# Patient Record
Sex: Male | Born: 1960 | Race: Black or African American | Hispanic: No | Smoking: Never smoker
Health system: Southern US, Community
[De-identification: ages and names within clinical notes are randomized; demographics above are authoritative.]

## PROBLEM LIST (undated history)

## (undated) DIAGNOSIS — E291 Testicular hypofunction: Secondary | ICD-10-CM

## (undated) DIAGNOSIS — R569 Unspecified convulsions: Secondary | ICD-10-CM

## (undated) DIAGNOSIS — E559 Vitamin D deficiency, unspecified: Secondary | ICD-10-CM

## (undated) DIAGNOSIS — F32A Depression, unspecified: Secondary | ICD-10-CM

## (undated) DIAGNOSIS — F329 Major depressive disorder, single episode, unspecified: Secondary | ICD-10-CM

## (undated) DIAGNOSIS — G8929 Other chronic pain: Secondary | ICD-10-CM

## (undated) DIAGNOSIS — E785 Hyperlipidemia, unspecified: Secondary | ICD-10-CM

## (undated) DIAGNOSIS — S069X9A Unspecified intracranial injury with loss of consciousness of unspecified duration, initial encounter: Secondary | ICD-10-CM

## (undated) DIAGNOSIS — S069XAA Unspecified intracranial injury with loss of consciousness status unknown, initial encounter: Secondary | ICD-10-CM

## (undated) DIAGNOSIS — G44309 Post-traumatic headache, unspecified, not intractable: Secondary | ICD-10-CM

## (undated) DIAGNOSIS — I10 Essential (primary) hypertension: Secondary | ICD-10-CM

## (undated) HISTORY — DX: Other chronic pain: G89.29

## (undated) HISTORY — DX: Unspecified convulsions: R56.9

## (undated) HISTORY — DX: Major depressive disorder, single episode, unspecified: F32.9

## (undated) HISTORY — DX: Post-traumatic headache, unspecified, not intractable: G44.309

## (undated) HISTORY — DX: Hyperlipidemia, unspecified: E78.5

## (undated) HISTORY — PX: COLONOSCOPY: SHX174

## (undated) HISTORY — DX: Unspecified intracranial injury with loss of consciousness status unknown, initial encounter: S06.9XAA

## (undated) HISTORY — DX: Depression, unspecified: F32.A

## (undated) HISTORY — DX: Unspecified intracranial injury with loss of consciousness of unspecified duration, initial encounter: S06.9X9A

## (undated) HISTORY — DX: Essential (primary) hypertension: I10

---
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
## 2007-07-30 ENCOUNTER — Emergency Department (HOSPITAL_COMMUNITY): Admission: EM | Admit: 2007-07-30 | Discharge: 2007-07-30 | Payer: Self-pay | Admitting: Emergency Medicine

## 2009-05-15 IMAGING — CR DG CHEST 2V
2 series · 2 of 2 positions shown · non-contrast
Comparison: None.

CLINICAL DATA: Shortness of breath.  Fever.  
 CHEST ? 2 VIEW:

[view not recorded (1 of 2)]
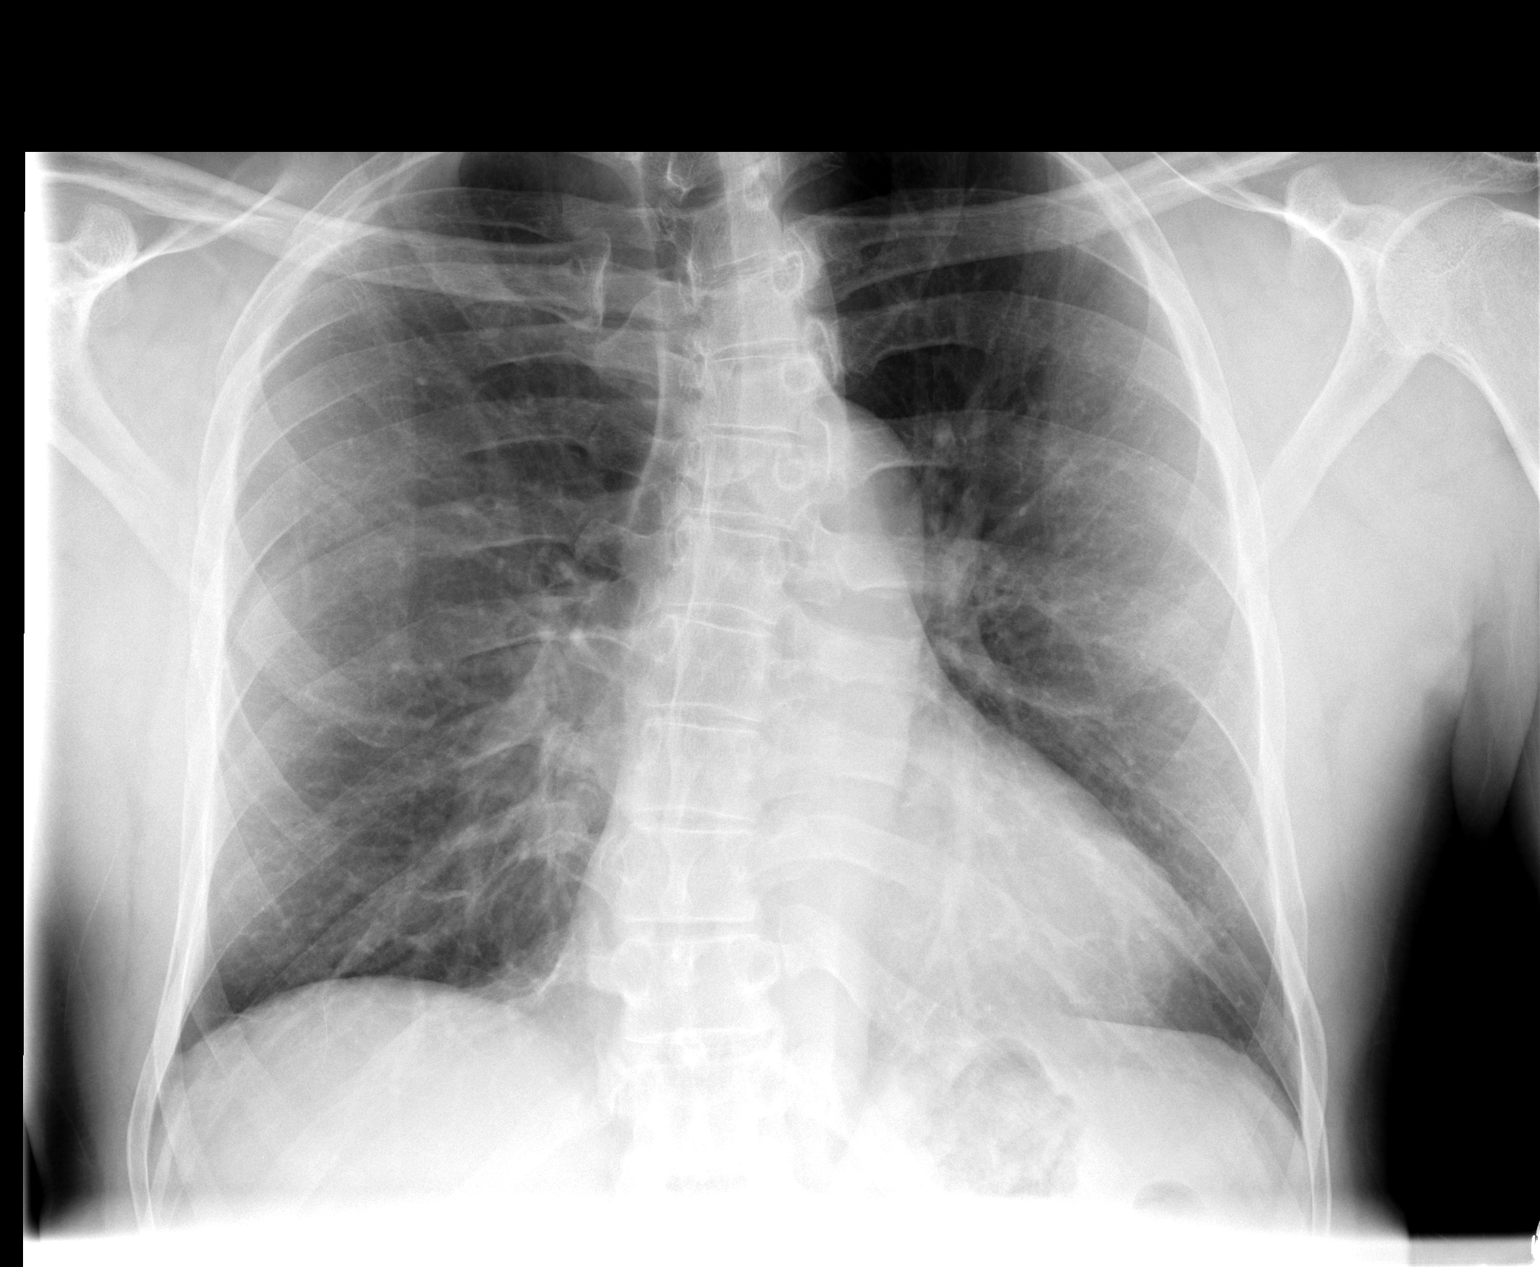

[view not recorded (2 of 2)]
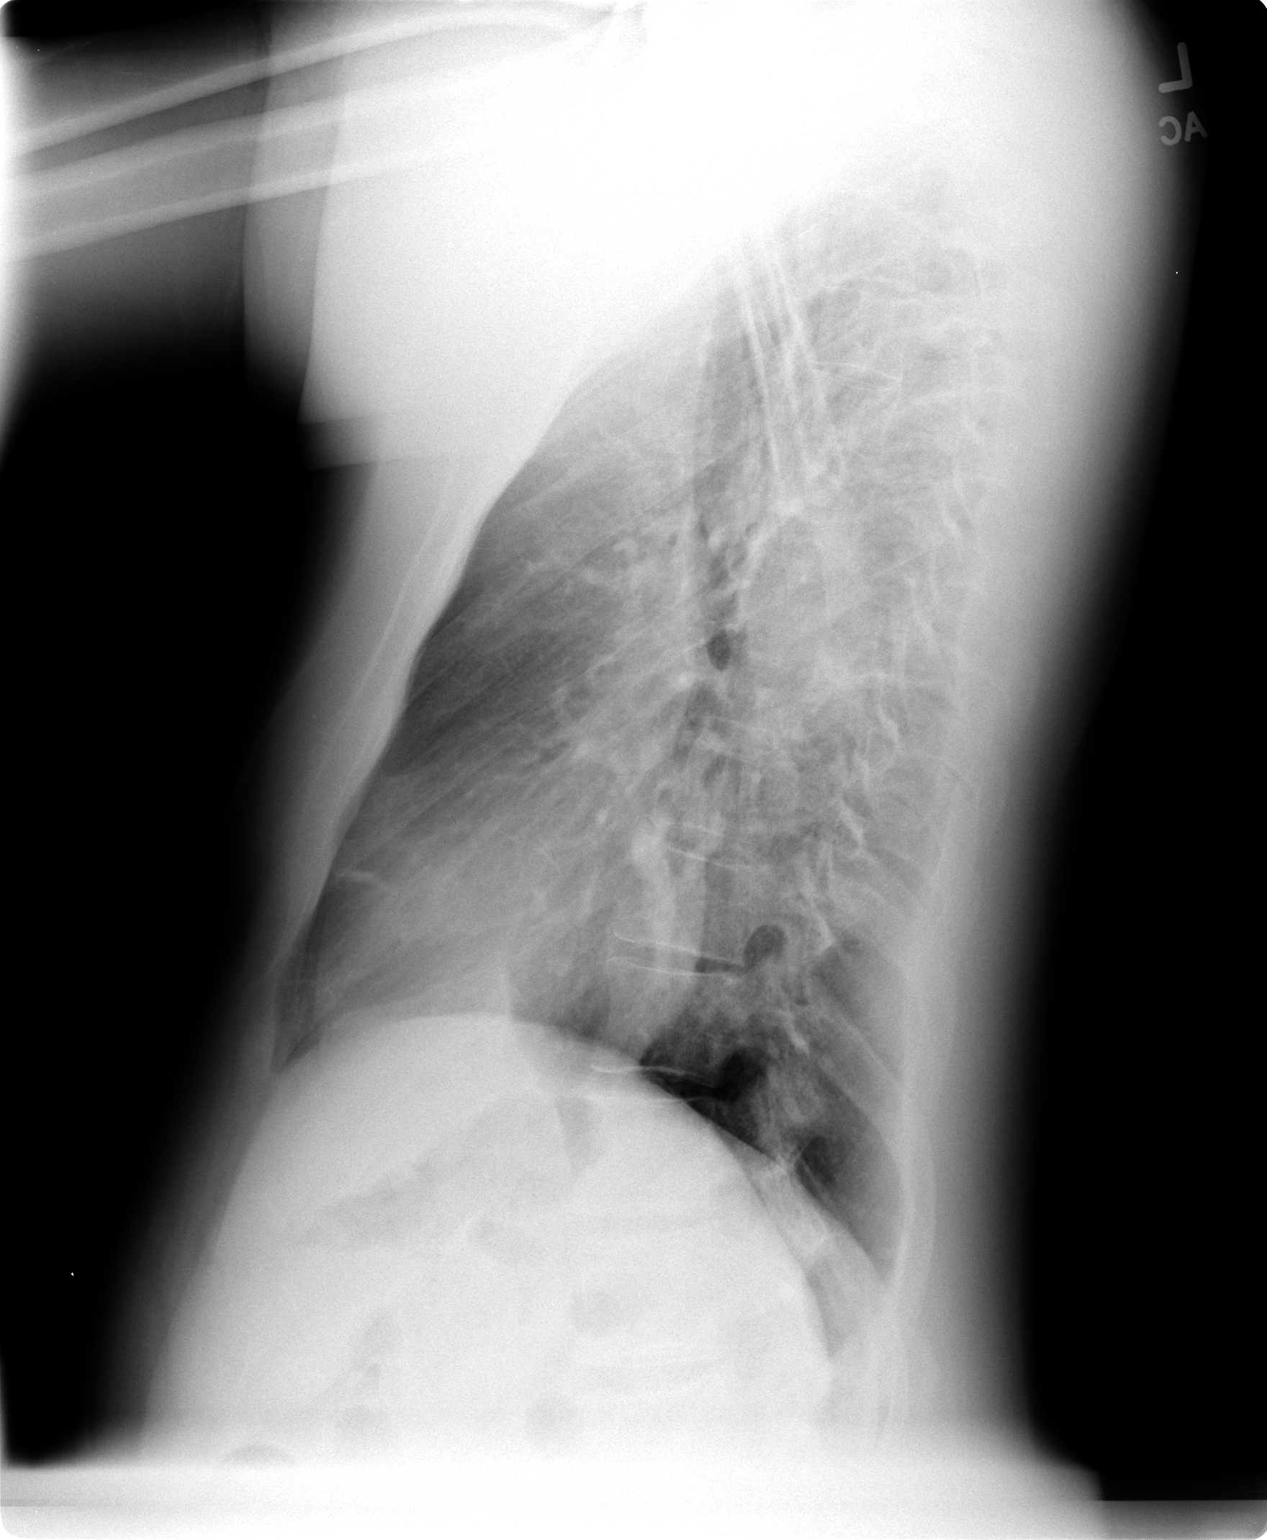

[2 of 2 positions shown; findings below may reference images not displayed]

FINDINGS: There is scoliosis and thoracic lordosis.  There is no infiltrate or effusion.  The lungs are clear.
IMPRESSION: No active cardiopulmonary disease.

## 2011-04-09 LAB — CBC
HCT: 41.2
Hemoglobin: 14.1
Platelets: 469 — ABNORMAL HIGH
WBC: 6.7

## 2011-04-09 LAB — BASIC METABOLIC PANEL
Chloride: 102
Creatinine, Ser: 1.11
Potassium: 3.9

## 2011-04-09 LAB — INFLUENZA A+B VIRUS AG-DIRECT(RAPID): Influenza B Ag: NEGATIVE

## 2011-04-09 LAB — URINALYSIS, ROUTINE W REFLEX MICROSCOPIC
Nitrite: NEGATIVE
Specific Gravity, Urine: 1.02
pH: 7

## 2011-04-09 LAB — DIFFERENTIAL
Eosinophils Relative: 3
Lymphocytes Relative: 34
Lymphs Abs: 2.3

## 2011-09-12 ENCOUNTER — Ambulatory Visit (INDEPENDENT_AMBULATORY_CARE_PROVIDER_SITE_OTHER): Payer: BC Managed Care – PPO | Admitting: Family Medicine

## 2011-09-12 DIAGNOSIS — E782 Mixed hyperlipidemia: Secondary | ICD-10-CM

## 2011-09-12 DIAGNOSIS — E785 Hyperlipidemia, unspecified: Secondary | ICD-10-CM

## 2011-09-12 DIAGNOSIS — I10 Essential (primary) hypertension: Secondary | ICD-10-CM

## 2011-09-12 LAB — COMPREHENSIVE METABOLIC PANEL
ALT: 19 U/L (ref 0–53)
AST: 19 U/L (ref 0–37)
Albumin: 4.6 g/dL (ref 3.5–5.2)
Alkaline Phosphatase: 87 U/L (ref 39–117)
Glucose, Bld: 81 mg/dL (ref 70–99)
Potassium: 5.1 mEq/L (ref 3.5–5.3)
Sodium: 139 mEq/L (ref 135–145)
Total Protein: 7.9 g/dL (ref 6.0–8.3)

## 2011-09-12 LAB — LIPID PANEL
LDL Cholesterol: 83 mg/dL (ref 0–99)
Triglycerides: 109 mg/dL (ref <150)

## 2011-09-12 MED ORDER — LISINOPRIL 10 MG PO TABS: 10.0000 mg | ORAL_TABLET | Freq: Every day | ORAL | Status: DC

## 2011-09-12 MED ORDER — SIMVASTATIN 20 MG PO TABS: 20.0000 mg | ORAL_TABLET | Freq: Every evening | ORAL | Status: DC

## 2011-09-12 NOTE — Patient Instructions (Signed)
Continue to get regular exercise and keep your weight good. If you have problems arise come in for a recheck.

## 2011-09-12 NOTE — Progress Notes (Signed)
Subjective: Patient is here for a routine visit with regard to his blood pressure and cholesterol he needs a refill of his prescriptions. He has no other major acute complaints. His review of systems is essentially unremarkable. No breathing or chest symptoms. 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  Objective: Healthy-appearing Afro-American male in no acute distress. TMs are normal. Throat clear. Eyes PERRLA. Neck supple without significant nodes. Chest is clear to auscultation. Heart regular without murmurs gallops or arrhythmias.  Assessment  hypertension, good control Hyperlipidemia  Plan: We'll check his labs. Will continue him on the same medications. He is asked to come back in about 6 months   When labs are reviewed please note that the patient did eat some cereal for breakfast today

## 2011-09-15 ENCOUNTER — Encounter: Payer: Self-pay | Admitting: *Deleted

## 2012-05-19 ENCOUNTER — Other Ambulatory Visit: Payer: Self-pay | Admitting: Family Medicine

## 2012-06-23 ENCOUNTER — Other Ambulatory Visit: Payer: Self-pay | Admitting: Physician Assistant

## 2012-06-23 NOTE — Telephone Encounter (Signed)
Needs OV /labs

## 2012-07-24 ENCOUNTER — Other Ambulatory Visit: Payer: Self-pay | Admitting: Physician Assistant

## 2012-07-24 ENCOUNTER — Ambulatory Visit (INDEPENDENT_AMBULATORY_CARE_PROVIDER_SITE_OTHER): Payer: BC Managed Care – PPO | Admitting: Family Medicine

## 2012-07-24 VITALS — BP 123/81 | HR 76 | Temp 98.1°F | Resp 16 | Ht 72.0 in | Wt 180.6 lb

## 2012-07-24 DIAGNOSIS — E785 Hyperlipidemia, unspecified: Secondary | ICD-10-CM

## 2012-07-24 DIAGNOSIS — I10 Essential (primary) hypertension: Secondary | ICD-10-CM

## 2012-07-24 LAB — COMPREHENSIVE METABOLIC PANEL
ALT: 20 U/L (ref 0–53)
AST: 17 U/L (ref 0–37)
Albumin: 4.2 g/dL (ref 3.5–5.2)
CO2: 28 mEq/L (ref 19–32)
Calcium: 9.8 mg/dL (ref 8.4–10.5)
Chloride: 105 mEq/L (ref 96–112)
Potassium: 4.8 mEq/L (ref 3.5–5.3)
Sodium: 140 mEq/L (ref 135–145)
Total Protein: 7.5 g/dL (ref 6.0–8.3)

## 2012-07-24 LAB — LIPID PANEL: Cholesterol: 173 mg/dL (ref 0–200)

## 2012-07-24 MED ORDER — LISINOPRIL 10 MG PO TABS: 10.0000 mg | ORAL_TABLET | Freq: Every day | ORAL | Status: DC

## 2012-07-24 MED ORDER — SIMVASTATIN 20 MG PO TABS: 20.0000 mg | ORAL_TABLET | Freq: Every day | ORAL | Status: DC

## 2012-07-24 NOTE — Progress Notes (Signed)
Subjective: Travis Henry is here for a followup visit and refill on his medications. He runs out of them today. He's been doing well. He denies any new problems. No headaches or dizziness. No chest pain or shortness of breath. No GI or other symptoms. He had a colonoscopy 1 year ago. He's been getting 30 day prescriptions but we are going to give them and ninety-day intervals now. No other questions or complaints.  Objective: Pleasant gentleman no acute distress. Weight is the same as last year. His TMs are normal. Throat clear. Neck supple without nodes or thyromegaly. Chest is clear to auscultation. Heart regular without murmurs gallops or arrhythmias. Abdomen soft without organomegaly mass or tenderness.  Assessment: Hypertension, good control Hyperlipidemia, good control on the last set of labs  Plan: Check lipids and comprehensive metabolic panel. We'll let her know the results of his labs. Sent his medicines to the pharmacy, leaving him on the same meds. Return one year.

## 2012-07-24 NOTE — Telephone Encounter (Signed)
°  WAS HERE 20 MIN AGO; GOT PRESCRIPTION FOR BP MEDICATION AND IT HASN'T GOT TO PHARMACY YET....   [phone]

## 2012-07-24 NOTE — Patient Instructions (Addendum)
Continue same medicines.  Return if problems arise.  Return in one year

## 2012-07-25 ENCOUNTER — Encounter: Payer: Self-pay | Admitting: *Deleted

## 2013-04-20 ENCOUNTER — Other Ambulatory Visit: Payer: Self-pay | Admitting: Family Medicine

## 2013-07-18 ENCOUNTER — Other Ambulatory Visit: Payer: Self-pay | Admitting: Family Medicine

## 2013-08-05 ENCOUNTER — Ambulatory Visit (INDEPENDENT_AMBULATORY_CARE_PROVIDER_SITE_OTHER): Payer: BC Managed Care – PPO | Admitting: Emergency Medicine

## 2013-08-05 VITALS — BP 124/84 | HR 74 | Temp 98.4°F | Resp 18 | Wt 187.0 lb

## 2013-08-05 DIAGNOSIS — Z0000 Encounter for general adult medical examination without abnormal findings: Secondary | ICD-10-CM

## 2013-08-05 DIAGNOSIS — I10 Essential (primary) hypertension: Secondary | ICD-10-CM

## 2013-08-05 DIAGNOSIS — E785 Hyperlipidemia, unspecified: Secondary | ICD-10-CM

## 2013-08-05 LAB — COMPREHENSIVE METABOLIC PANEL
ALBUMIN: 4.3 g/dL (ref 3.5–5.2)
ALK PHOS: 87 U/L (ref 39–117)
ALT: 16 U/L (ref 0–53)
AST: 16 U/L (ref 0–37)
BUN: 15 mg/dL (ref 6–23)
CO2: 27 mEq/L (ref 19–32)
Calcium: 9.7 mg/dL (ref 8.4–10.5)
Chloride: 105 mEq/L (ref 96–112)
Creat: 1.1 mg/dL (ref 0.50–1.35)
GLUCOSE: 82 mg/dL (ref 70–99)
POTASSIUM: 4.8 meq/L (ref 3.5–5.3)
SODIUM: 140 meq/L (ref 135–145)
TOTAL PROTEIN: 7.7 g/dL (ref 6.0–8.3)
Total Bilirubin: 0.8 mg/dL (ref 0.3–1.2)

## 2013-08-05 LAB — POCT URINALYSIS DIPSTICK
Bilirubin, UA: NEGATIVE
Blood, UA: NEGATIVE
GLUCOSE UA: NEGATIVE
Ketones, UA: NEGATIVE
Leukocytes, UA: NEGATIVE
NITRITE UA: NEGATIVE
UROBILINOGEN UA: 0.2
pH, UA: 5.5

## 2013-08-05 LAB — POCT CBC
GRANULOCYTE PERCENT: 45.8 % (ref 37–80)
HEMATOCRIT: 46.3 % (ref 43.5–53.7)
HEMOGLOBIN: 15 g/dL (ref 14.1–18.1)
Lymph, poc: 2.5 (ref 0.6–3.4)
MCH, POC: 29.1 pg (ref 27–31.2)
MCHC: 32.4 g/dL (ref 31.8–35.4)
MCV: 89.7 fL (ref 80–97)
MID (CBC): 0.5 (ref 0–0.9)
MPV: 8.2 fL (ref 0–99.8)
PLATELET COUNT, POC: 392 10*3/uL (ref 142–424)
POC Granulocyte: 2.5 (ref 2–6.9)
POC LYMPH PERCENT: 45.4 %L (ref 10–50)
POC MID %: 8.8 %M (ref 0–12)
RBC: 5.16 M/uL (ref 4.69–6.13)
RDW, POC: 13.6 %
WBC: 5.4 10*3/uL (ref 4.6–10.2)

## 2013-08-05 LAB — LIPID PANEL
Cholesterol: 157 mg/dL (ref 0–200)
HDL: 51 mg/dL (ref >39)
LDL CALC: 80 mg/dL (ref 0–99)
Total CHOL/HDL Ratio: 3.1 Ratio
Triglycerides: 130 mg/dL (ref <150)
VLDL: 26 mg/dL (ref 0–40)

## 2013-08-05 MED ORDER — LISINOPRIL 10 MG PO TABS: 10.0000 mg | ORAL_TABLET | Freq: Every day | ORAL | Status: DC

## 2013-08-05 NOTE — Progress Notes (Signed)
[facility] [address], [place] [id number] [phone]  Date:  08/05/2013   Name:  Travis Henry   DOB:  June 01, 1961   MRN:  [id number]  PCP:  Nilda SimmerSMITH,KRISTI, MD    Chief Complaint: Hypertension   History of Present Illness:  Travis Henry is a 53 y.o. very pleasant male patient who presents with the following:  History of HBP and elevated cholesterol.  Due to run out of meds and needs refills.  Tolerating medication with no adverse effects.  No evidence of end organ disease.  Had colonoscopy last year.  Nonsmoker.  Had a flu shot.  No improvement with over the counter medications or other home remedies. Denies other complaint or health concern today.   Patient Active Problem List   Diagnosis Date Noted  . HTN (hypertension) 07/24/2012  . Hyperlipidemia 07/24/2012    No past medical history on file.  No past surgical history on file.  History  Substance Use Topics  . Smoking status: Never Smoker   . Smokeless tobacco: Not on file  . Alcohol Use: No    No family history on file.  No Known Allergies  Medication list has been reviewed and updated.  Current Outpatient Prescriptions on File Prior to Visit  Medication Sig Dispense Refill  . lisinopril (PRINIVIL,ZESTRIL) 10 MG tablet TAKE 1 TABLET EVERY DAY  30 tablet  0  . simvastatin (ZOCOR) 20 MG tablet TAKE 1 TABLET (20 MG TOTAL) BY MOUTH EVERY EVENING.  30 tablet  0   No current facility-administered medications on file prior to visit.    Review of Systems:  As per HPI, otherwise negative.    Physical Examination: Filed Vitals:   08/05/13 1122  BP: 124/84  Pulse: 74  Temp: 98.4 F (36.9 C)  Resp: 18   Filed Vitals:   08/05/13 1122  Weight: 187 lb (84.823 kg)   Body mass index is 25.36 kg/(m^2). Ideal Body Weight:    GEN: WDWN, NAD, Non-toxic, A & O x 3 HEENT: Atraumatic, Normocephalic. Neck supple. No masses, No LAD. Ears and Nose: No external deformity. CV: RRR,  No M/G/R. No JVD. No thrill. No extra heart sounds. PULM: CTA B, no wheezes, crackles, rhonchi. No retractions. No resp. distress. No accessory muscle use. ABD: S, NT, ND, +BS. No rebound. No HSM. EXTR: No c/c/e NEURO Normal gait.  PSYCH: Normally interactive. Conversant. Not depressed or anxious appearing.  Calm demeanor.    Assessment and Plan: Wellness examination Labs pending Hypertension Hyperlipidemia  Signed,  Phillips OdorJeffery Anderson, MD

## 2013-08-05 NOTE — Patient Instructions (Signed)

## 2013-08-06 LAB — PSA: PSA: 1.03 ng/mL (ref <=4.00)

## 2013-09-23 ENCOUNTER — Other Ambulatory Visit: Payer: Self-pay | Admitting: Physician Assistant

## 2013-09-24 NOTE — Telephone Encounter (Signed)
Patient called stated he is out of his meds for BP & cholesterol. [facility] until today he had no refills left. Patient is going out of town and needs this asap. [facility] [place]. Please let pt know when done. Patient stated normally his med refills are for a year and this time only for 3 months. Best # for patient [phone]

## 2013-12-21 ENCOUNTER — Other Ambulatory Visit: Payer: Self-pay | Admitting: Emergency Medicine

## 2014-02-16 ENCOUNTER — Other Ambulatory Visit: Payer: Self-pay | Admitting: Emergency Medicine

## 2014-02-17 ENCOUNTER — Other Ambulatory Visit: Payer: Self-pay | Admitting: Emergency Medicine

## 2014-03-16 ENCOUNTER — Other Ambulatory Visit: Payer: Self-pay | Admitting: Physician Assistant

## 2014-03-17 ENCOUNTER — Ambulatory Visit (INDEPENDENT_AMBULATORY_CARE_PROVIDER_SITE_OTHER): Payer: BC Managed Care – PPO | Admitting: Family Medicine

## 2014-03-17 VITALS — BP 134/90 | HR 70 | Temp 98.0°F | Resp 16 | Ht 72.0 in | Wt 189.4 lb

## 2014-03-17 DIAGNOSIS — I10 Essential (primary) hypertension: Secondary | ICD-10-CM

## 2014-03-17 DIAGNOSIS — E785 Hyperlipidemia, unspecified: Secondary | ICD-10-CM

## 2014-03-17 MED ORDER — LISINOPRIL 10 MG PO TABS: 10.0000 mg | ORAL_TABLET | Freq: Every day | ORAL | Status: DC

## 2014-03-17 MED ORDER — SIMVASTATIN 20 MG PO TABS: 20.0000 mg | ORAL_TABLET | Freq: Every day | ORAL | Status: DC

## 2014-03-17 NOTE — Progress Notes (Signed)
° °  Subjective:    Patient ID: Travis Henry, male    DOB: June 29, 1961, 53 y.o.   MRN: [id number]  HPI Chief Complaint  Patient presents with   Medication Refill    Lisinopril & Simvastatin-Request 90 day supply on both medication   This chart was scribed for Elvina Sidle , MD by Andrew Au, ED Scribe. This patient was seen in room 1 and the patient's care was started at 1:32 PM.  HPI Comments: Travis Henry is a 53 y.o. male who [facility]ill. Pt is requesting medication refill of lisinopril and simvastatin. Pt state he is tolerating medication well. Pt reports last blood work was 7 months ago. Pt is a non smoker. Pt has family h/o MI. 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  Past Medical History  Diagnosis Date   Hyperlipidemia    Hypertension    History reviewed. No pertinent past surgical history. Prior to Admission medications   Medication Sig Start Date End Date Taking? Authorizing Provider  ibuprofen (ADVIL,MOTRIN) 200 MG tablet Take 200 mg by mouth every 6 (six) hours as needed.   Yes Historical Provider, MD  lisinopril (PRINIVIL,ZESTRIL) 10 MG tablet Take 1 tablet (10 mg total) by mouth daily. PATIENT NEEDS OFFICE VISIT FOR ADDITIONAL REFILLS 02/16/14  Yes Heather M Marte, PA-C  simvastatin (ZOCOR) 20 MG tablet TAKE 1 TABLET (20 MG TOTAL) BY MOUTH EVERY EVENING. 07/24/12  Yes Anders Simmonds, PA-C   Review of Systems     Objective:   Physical Exam  Nursing note and vitals reviewed. Constitutional: He is oriented to person, place, and time. He appears well-developed and well-nourished. No distress.  HENT:  Head: Normocephalic and atraumatic.  Eyes: Conjunctivae and EOM are normal.  Neck: Neck supple.  Cardiovascular: Normal rate, regular rhythm and normal heart sounds.  Exam reveals no gallop and no friction rub.   No murmur heard. Pulmonary/Chest: Effort normal and  breath sounds normal. No respiratory distress. He has no wheezes. He has no rales. He exhibits no tenderness.  Musculoskeletal: Normal range of motion.  Neurological: He is alert and oriented to person, place, and time.  Skin: Skin is warm and dry.  Psychiatric: He has a normal mood and affect. His behavior is normal.   Filed Vitals:   03/17/14 1257  BP: 134/90  Pulse: 70  Temp: 98 F (36.7 C)  TempSrc: Oral  Resp: 16  Height: 6' (1.829 m)  Weight: 189 lb 6 oz (85.9 kg)  SpO2: 99%   Assessment & Plan:   Essential hypertension - Plan: lisinopril (PRINIVIL,ZESTRIL) 10 MG tablet  Hyperlipidemia - Plan: simvastatin (ZOCOR) 20 MG tablet  Signed, Elvina Sidle, MD

## 2014-03-17 NOTE — Patient Instructions (Signed)

## 2014-12-17 ENCOUNTER — Other Ambulatory Visit: Payer: Self-pay | Admitting: Family Medicine

## 2015-01-19 ENCOUNTER — Other Ambulatory Visit: Payer: Self-pay | Admitting: Family Medicine

## 2015-01-21 ENCOUNTER — Ambulatory Visit (INDEPENDENT_AMBULATORY_CARE_PROVIDER_SITE_OTHER): Payer: BLUE CROSS/BLUE SHIELD | Admitting: Emergency Medicine

## 2015-01-21 VITALS — BP 140/80 | HR 70 | Temp 98.3°F | Resp 16 | Ht 72.0 in | Wt 192.0 lb

## 2015-01-21 DIAGNOSIS — I10 Essential (primary) hypertension: Secondary | ICD-10-CM | POA: Diagnosis not present

## 2015-01-21 DIAGNOSIS — E785 Hyperlipidemia, unspecified: Secondary | ICD-10-CM | POA: Diagnosis not present

## 2015-01-21 DIAGNOSIS — Z125 Encounter for screening for malignant neoplasm of prostate: Secondary | ICD-10-CM

## 2015-01-21 LAB — COMPLETE METABOLIC PANEL WITH GFR
ALK PHOS: 91 U/L (ref 39–117)
ALT: 17 U/L (ref 0–53)
AST: 15 U/L (ref 0–37)
Albumin: 4.1 g/dL (ref 3.5–5.2)
BILIRUBIN TOTAL: 0.8 mg/dL (ref 0.2–1.2)
BUN: 13 mg/dL (ref 6–23)
CALCIUM: 9.3 mg/dL (ref 8.4–10.5)
CO2: 25 mEq/L (ref 19–32)
CREATININE: 1.24 mg/dL (ref 0.50–1.35)
Chloride: 105 mEq/L (ref 96–112)
GFR, EST AFRICAN AMERICAN: 77 mL/min
GFR, Est Non African American: 67 mL/min
GLUCOSE: 97 mg/dL (ref 70–99)
Potassium: 4.6 mEq/L (ref 3.5–5.3)
Sodium: 141 mEq/L (ref 135–145)
TOTAL PROTEIN: 7.4 g/dL (ref 6.0–8.3)

## 2015-01-21 LAB — POCT CBC
Granulocyte percent: 50.7 %G (ref 37–80)
HEMATOCRIT: 42.4 % — AB (ref 43.5–53.7)
Hemoglobin: 14.5 g/dL (ref 14.1–18.1)
Lymph, poc: 1.9 (ref 0.6–3.4)
MCH, POC: 28.4 pg (ref 27–31.2)
MCHC: 34.3 g/dL (ref 31.8–35.4)
MCV: 82.9 fL (ref 80–97)
MID (cbc): 0.3 (ref 0–0.9)
MPV: 6.9 fL (ref 0–99.8)
PLATELET COUNT, POC: 414 10*3/uL (ref 142–424)
POC Granulocyte: 2.2 (ref 2–6.9)
POC LYMPH PERCENT: 42.8 %L (ref 10–50)
POC MID %: 6.5 %M (ref 0–12)
RBC: 5.11 M/uL (ref 4.69–6.13)
RDW, POC: 12.6 %
WBC: 4.4 10*3/uL — AB (ref 4.6–10.2)

## 2015-01-21 LAB — LIPID PANEL
CHOL/HDL RATIO: 2.9 ratio
Cholesterol: 161 mg/dL (ref 0–200)
HDL: 56 mg/dL (ref >=40)
LDL CALC: 82 mg/dL (ref 0–99)
Triglycerides: 113 mg/dL (ref <150)
VLDL: 23 mg/dL (ref 0–40)

## 2015-01-21 LAB — PSA: PSA: 0.96 ng/mL (ref <=4.00)

## 2015-01-21 MED ORDER — LISINOPRIL 10 MG PO TABS: ORAL_TABLET | ORAL | Status: DC

## 2015-01-21 MED ORDER — SIMVASTATIN 20 MG PO TABS: 20.0000 mg | ORAL_TABLET | Freq: Every day | ORAL | Status: DC

## 2015-01-21 NOTE — Progress Notes (Signed)
Subjective:  This chart was scribed for Lesle Chris MD, by Veverly Fells, at [facility].  This patient was seen in room 14 and the patient's care was started at 9:00 AM.    Patient ID: Travis Henry, male    DOB: February 22, 1961, 54 y.o.   MRN: [id number] Chief Complaint  Patient presents with  . Medication Refill    Lisinopril and Zocor    HPI  HPI Comments: Travis Henry is a 54 y.o. male who [facility] medication refill (Lisinopril and Zocor).  Patient denies any chest pain/difficulty breathing and has been compliant with his cholesterol and blood pressure medication.  His father passed from a heart attack at the age of 87 ( was a smoker).  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  Patient does not smoke.  He is up to date with his colonoscopy.  He has no other complaints today.   Patient Active Problem List   Diagnosis Date Noted  . HTN (hypertension) 07/24/2012  . Hyperlipidemia 07/24/2012   Past Medical History  Diagnosis Date  . Hyperlipidemia   . Hypertension    History reviewed. No pertinent past surgical history. No Known Allergies Prior to Admission medications   Medication Sig Start Date End Date Taking? Authorizing Provider  ibuprofen (ADVIL,MOTRIN) 200 MG tablet Take 200 mg by mouth every 6 (six) hours as needed.   Yes Historical Provider, MD  lisinopril (PRINIVIL,ZESTRIL) 10 MG tablet Take 1 tablet (10 mg total) by mouth daily. PATIENT NEEDS OFFICE VISIT FOR ADDITIONAL REFILLS 12/18/14  Yes Ethelda Chick, MD  simvastatin (ZOCOR) 20 MG tablet Take 1 tablet (20 mg total) by mouth daily at 6 PM. 03/17/14  Yes Elvina Sidle, MD   History   Social History  . Marital Status: Married    Spouse Name: N/A  . Number of Children: N/A  . Years of Education: N/A   Occupational History  . Not on file.   Social History Main  Topics  . Smoking status: Never Smoker   . Smokeless tobacco: Never Used  . Alcohol Use: No  . Drug Use: No  . Sexual Activity: Yes   Other Topics Concern  . Not on file   Social History Narrative     Current Outpatient Prescriptions on File Prior to Visit  Medication Sig Dispense Refill  . ibuprofen (ADVIL,MOTRIN) 200 MG tablet Take 200 mg by mouth every 6 (six) hours as needed.    Marland Kitchen lisinopril (PRINIVIL,ZESTRIL) 10 MG tablet Take 1 tablet (10 mg total) by mouth daily. PATIENT NEEDS OFFICE VISIT FOR ADDITIONAL REFILLS 30 tablet 0  . simvastatin (ZOCOR) 20 MG tablet Take 1 tablet (20 mg total) by mouth daily at 6 PM. 90 tablet 2   No current facility-administered medications on file prior to visit.    No Known Allergies     Review of Systems  Constitutional: Negative for fever and chills.  Eyes: Negative for pain, discharge and itching.  Respiratory: Negative for choking, chest tightness and shortness of breath.   Cardiovascular: Negative for chest pain.  Gastrointestinal: Negative for nausea and vomiting.  Musculoskeletal: Positive for back pain. Negative for myalgias and neck stiffness.       Objective:   Physical Exam  CONSTITUTIONAL: Well developed/well nourished HEAD: Normocephalic/atraumatic EYES: EOMI/PERRL ENMT: Mucous membranes moist NECK: supple no meningeal signs SPINE/BACK:entire  spine nontender CV: S1/S2 noted, no murmurs/rubs/gallops noted LUNGS: Lungs are clear to auscultation bilaterally, no apparent distress ABDOMEN: soft, nontender, no rebound or guarding, bowel sounds noted throughout abdomen GU:no cva tenderness NEURO: Pt is awake/alert/appropriate, moves all extremitiesx4.  No facial droop.   EXTREMITIES: pulses normal/equal, full ROM SKIN: warm, color normal PSYCH: no abnormalities of mood noted, alert and oriented to situation Results for orders placed or performed in visit on 01/21/15  POCT CBC  Result Value Ref Range   WBC 4.4 (A) 4.6  - 10.2 K/uL   Lymph, poc 1.9 0.6 - 3.4   POC LYMPH PERCENT 42.8 10 - 50 %L   MID (cbc) 0.3 0 - 0.9   POC MID % 6.5 0 - 12 %M   POC Granulocyte 2.2 2 - 6.9   Granulocyte percent 50.7 37 - 80 %G   RBC 5.11 4.69 - 6.13 M/uL   Hemoglobin 14.5 14.1 - 18.1 g/dL   HCT, POC 16.142.4 (A) 09.643.5 - 53.7 %   MCV 82.9 80 - 97 fL   MCH, POC 28.4 27 - 31.2 pg   MCHC 34.3 31.8 - 35.4 g/dL   RDW, POC 04.512.6 %   Platelet Count, POC 414 142 - 424 K/uL   MPV 6.9 0 - 99.8 fL    Filed Vitals:   01/21/15 0854  BP: 140/80  Pulse: 70  Temp: 98.3 F (36.8 C)  TempSrc: Oral  Resp: 16  Height: 6' (1.829 m)  Weight: 192 lb (87.091 kg)  SpO2: 99%       Assessment & Plan:  1. Essential hypertension I also started a baby aspirin daily because of his history of hypertension and high cholesterol as well as a family history of heart disease - POCT CBC - COMPLETE METABOLIC PANEL WITH GFR - lisinopril (PRINIVIL,ZESTRIL) 10 MG tablet; Take 1 tablet daily  Dispense: 90 tablet; Refill: 3  2. Hyperlipidemia  - Lipid panel - simvastatin (ZOCOR) 20 MG tablet; Take 1 tablet (20 mg total) by mouth daily at 6 PM.  Dispense: 90 tablet; Refill: 3  3. Prostate cancer screening  - PSA   I personally performed the services described in this documentation, which was scribed in my presence. The recorded information has been reviewed and is accurate.  Lesle ChrisSteven Melenie Minniear, MD  [facility]  01/21/2015 9:44 AM

## 2015-01-21 NOTE — Patient Instructions (Addendum)
Health Maintenance   Take a baby aspirin everyday.   A healthy lifestyle and preventative care can promote health and wellness.  Maintain regular health, dental, and eye exams.  Eat a healthy diet. Foods like vegetables, fruits, whole grains, low-fat dairy products, and lean protein foods contain the nutrients you need and are low in calories. Decrease your intake of foods high in solid fats, added sugars, and salt. Get information about a proper diet from your health care provider, if necessary.  Regular physical exercise is one of the most important things you can do for your health. Most adults should get at least 150 minutes of moderate-intensity exercise (any activity that increases your heart rate and causes you to sweat) each week. In addition, most adults need muscle-strengthening exercises on 2 or more days a week.   Maintain a healthy weight. The body mass index (BMI) is a screening tool to identify possible weight problems. It provides an estimate of body fat based on height and weight. Your health care provider can find your BMI and can help you achieve or maintain a healthy weight. For males 20 years and older:  A BMI below 18.5 is considered underweight.  A BMI of 18.5 to 24.9 is normal.  A BMI of 25 to 29.9 is considered overweight.  A BMI of 30 and above is considered obese.  Maintain normal blood lipids and cholesterol by exercising and minimizing your intake of saturated fat. Eat a balanced diet with plenty of fruits and vegetables. Blood tests for lipids and cholesterol should begin at age 52 and be repeated every 5 years. If your lipid or cholesterol levels are high, you are over age 52, or you are at high risk for heart disease, you may need your cholesterol levels checked more frequently.Ongoing high lipid and cholesterol levels should be treated with medicines if diet and exercise are not working.  If you smoke, find out from your health care provider how to quit. If  you do not use tobacco, do not start.  Lung cancer screening is recommended for adults aged 55-80 years who are at high risk for developing lung cancer because of a history of smoking. A yearly low-dose CT scan of the lungs is recommended for people who have at least a 30-pack-year history of smoking and are current smokers or have quit within the past 15 years. A pack year of smoking is smoking an average of 1 pack of cigarettes a day for 1 year (for example, a 30-pack-year history of smoking could mean smoking 1 pack a day for 30 years or 2 packs a day for 15 years). Yearly screening should continue until the smoker has stopped smoking for at least 15 years. Yearly screening should be stopped for people who develop a health problem that would prevent them from having lung cancer treatment.  If you choose to drink alcohol, do not have more than 2 drinks per day. One drink is considered to be 12 oz (360 mL) of beer, 5 oz (150 mL) of wine, or 1.5 oz (45 mL) of liquor.  Avoid the use of street drugs. Do not share needles with anyone. Ask for help if you need support or instructions about stopping the use of drugs.  High blood pressure causes heart disease and increases the risk of stroke. Blood pressure should be checked at least every 1-2 years. Ongoing high blood pressure should be treated with medicines if weight loss and exercise are not effective.  If you are  13-17 years old, ask your health care provider if you should take aspirin to prevent heart disease.  Diabetes screening involves taking a blood sample to check your fasting blood sugar level. This should be done once every 3 years after age 35 if you are at a normal weight and without risk factors for diabetes. Testing should be considered at a younger age or be carried out more frequently if you are overweight and have at least 1 risk factor for diabetes.  Colorectal cancer can be detected and often prevented. Most routine colorectal cancer  screening begins at the age of 66 and continues through age 66. However, your health care provider may recommend screening at an earlier age if you have risk factors for colon cancer. On a yearly basis, your health care provider may provide home test kits to check for hidden blood in the stool. A small camera at the end of a tube may be used to directly examine the colon (sigmoidoscopy or colonoscopy) to detect the earliest forms of colorectal cancer. Talk to your health care provider about this at age 39 when routine screening begins. A direct exam of the colon should be repeated every 5-10 years through age 15, unless early forms of precancerous polyps or small growths are found.  People who are at an increased risk for hepatitis B should be screened for this virus. You are considered at high risk for hepatitis B if:  You were born in a country where hepatitis B occurs often. Talk with your health care provider about which countries are considered high risk.  Your parents were born in a high-risk country and you have not received a shot to protect against hepatitis B (hepatitis B vaccine).  You have HIV or AIDS.  You use needles to inject street drugs.  You live with, or have sex with, someone who has hepatitis B.  You are a man who has sex with other men (MSM).  You get hemodialysis treatment.  You take certain medicines for conditions like cancer, organ transplantation, and autoimmune conditions.  Hepatitis C blood testing is recommended for all people born from [birth year] through 1963 and any individual with known risk factors for hepatitis C.  Healthy men should no longer receive prostate-specific antigen (PSA) blood tests as part of routine cancer screening. Talk to your health care provider about prostate cancer screening.  Testicular cancer screening is not recommended for adolescents or adult males who have no symptoms. Screening includes self-exam, a health care provider exam, and other  screening tests. Consult with your health care provider about any symptoms you have or any concerns you have about testicular cancer.  Practice safe sex. Use condoms and avoid high-risk sexual practices to reduce the spread of sexually transmitted infections (STIs).  You should be screened for STIs, including gonorrhea and chlamydia if:  You are sexually active and are younger than 24 years.  You are older than 24 years, and your health care provider tells you that you are at risk for this type of infection.  Your sexual activity has changed since you were last screened, and you are at an increased risk for chlamydia or gonorrhea. Ask your health care provider if you are at risk.  If you are at risk of being infected with HIV, it is recommended that you take a prescription medicine daily to prevent HIV infection. This is called pre-exposure prophylaxis (PrEP). You are considered at risk if:  You are a man who has sex with other  men (MSM).  You are a heterosexual man who is sexually active with multiple partners.  You take drugs by injection.  You are sexually active with a partner who has HIV.  Talk with your health care provider about whether you are at high risk of being infected with HIV. If you choose to begin PrEP, you should first be tested for HIV. You should then be tested every 3 months for as long as you are taking PrEP.  Use sunscreen. Apply sunscreen liberally and repeatedly throughout the day. You should seek shade when your shadow is shorter than you. Protect yourself by wearing long sleeves, pants, a wide-brimmed hat, and sunglasses year round whenever you are outdoors.  Tell your health care provider of new moles or changes in moles, especially if there is a change in shape or color. Also, tell your health care provider if a mole is larger than the size of a pencil eraser.  A one-time screening for abdominal aortic aneurysm (AAA) and surgical repair of large AAAs by  ultrasound is recommended for men aged 78-75 years who are current or former smokers.  Stay current with your vaccines (immunizations). Document Released: 01/02/2008 Document Revised: 07/11/2013 Document Reviewed: 12/01/2010 [facility] Patient Information 2015 [place], [place]. This information is not intended to replace advice given to you by your health care provider. Make sure you discuss any questions you have with your health care provider.

## 2015-04-09 ENCOUNTER — Ambulatory Visit: Payer: Self-pay | Admitting: Neurology

## 2016-08-17 DIAGNOSIS — E291 Testicular hypofunction: Secondary | ICD-10-CM | POA: Diagnosis not present

## 2016-08-17 DIAGNOSIS — I10 Essential (primary) hypertension: Secondary | ICD-10-CM | POA: Diagnosis not present

## 2016-08-31 DIAGNOSIS — I119 Hypertensive heart disease without heart failure: Secondary | ICD-10-CM | POA: Diagnosis not present

## 2016-10-07 DIAGNOSIS — M1612 Unilateral primary osteoarthritis, left hip: Secondary | ICD-10-CM | POA: Diagnosis not present

## 2016-10-13 DIAGNOSIS — M25552 Pain in left hip: Secondary | ICD-10-CM | POA: Diagnosis not present

## 2016-11-19 DIAGNOSIS — E559 Vitamin D deficiency, unspecified: Secondary | ICD-10-CM | POA: Diagnosis not present

## 2016-11-19 DIAGNOSIS — I119 Hypertensive heart disease without heart failure: Secondary | ICD-10-CM | POA: Diagnosis not present

## 2016-11-19 DIAGNOSIS — R5383 Other fatigue: Secondary | ICD-10-CM | POA: Diagnosis not present

## 2017-09-13 DIAGNOSIS — I10 Essential (primary) hypertension: Secondary | ICD-10-CM | POA: Diagnosis not present

## 2017-10-11 DIAGNOSIS — I10 Essential (primary) hypertension: Secondary | ICD-10-CM | POA: Diagnosis not present

## 2017-10-11 DIAGNOSIS — R5383 Other fatigue: Secondary | ICD-10-CM | POA: Diagnosis not present

## 2017-10-11 DIAGNOSIS — Z125 Encounter for screening for malignant neoplasm of prostate: Secondary | ICD-10-CM | POA: Diagnosis not present

## 2017-10-11 DIAGNOSIS — Z0000 Encounter for general adult medical examination without abnormal findings: Secondary | ICD-10-CM | POA: Diagnosis not present

## 2017-10-11 DIAGNOSIS — E785 Hyperlipidemia, unspecified: Secondary | ICD-10-CM | POA: Diagnosis not present

## 2017-10-11 DIAGNOSIS — E559 Vitamin D deficiency, unspecified: Secondary | ICD-10-CM | POA: Diagnosis not present

## 2017-10-11 DIAGNOSIS — I119 Hypertensive heart disease without heart failure: Secondary | ICD-10-CM | POA: Diagnosis not present

## 2017-10-20 ENCOUNTER — Encounter: Payer: Self-pay | Admitting: Gastroenterology

## 2017-10-31 DIAGNOSIS — J309 Allergic rhinitis, unspecified: Secondary | ICD-10-CM | POA: Diagnosis not present

## 2017-11-09 DIAGNOSIS — E291 Testicular hypofunction: Secondary | ICD-10-CM | POA: Diagnosis not present

## 2017-11-16 DIAGNOSIS — E291 Testicular hypofunction: Secondary | ICD-10-CM | POA: Diagnosis not present

## 2017-11-30 DIAGNOSIS — E291 Testicular hypofunction: Secondary | ICD-10-CM | POA: Diagnosis not present

## 2017-12-07 DIAGNOSIS — E291 Testicular hypofunction: Secondary | ICD-10-CM | POA: Diagnosis not present

## 2017-12-15 DIAGNOSIS — E291 Testicular hypofunction: Secondary | ICD-10-CM | POA: Diagnosis not present

## 2017-12-21 DIAGNOSIS — E291 Testicular hypofunction: Secondary | ICD-10-CM | POA: Diagnosis not present

## 2017-12-28 DIAGNOSIS — E291 Testicular hypofunction: Secondary | ICD-10-CM | POA: Diagnosis not present

## 2018-01-04 DIAGNOSIS — E291 Testicular hypofunction: Secondary | ICD-10-CM | POA: Diagnosis not present

## 2018-01-07 ENCOUNTER — Telehealth: Payer: Self-pay | Admitting: *Deleted

## 2018-01-07 ENCOUNTER — Encounter: Payer: Self-pay | Admitting: *Deleted

## 2018-01-07 ENCOUNTER — Other Ambulatory Visit: Payer: Self-pay | Admitting: *Deleted

## 2018-01-07 ENCOUNTER — Encounter: Payer: Self-pay | Admitting: Gastroenterology

## 2018-01-07 ENCOUNTER — Ambulatory Visit: Payer: 59 | Admitting: Gastroenterology

## 2018-01-07 DIAGNOSIS — Z1211 Encounter for screening for malignant neoplasm of colon: Secondary | ICD-10-CM

## 2018-01-07 MED ORDER — NA SULFATE-K SULFATE-MG SULF 17.5-3.13-1.6 GM/177ML PO SOLN: 1.0000 | ORAL | 0 refills | Status: DC

## 2018-01-07 NOTE — Progress Notes (Signed)
cc'ed to pcp °

## 2018-01-07 NOTE — Telephone Encounter (Signed)
PA for colonoscopy approved via [employer]. Auth # [id number]

## 2018-01-07 NOTE — Patient Instructions (Signed)
You have been scheduled for a routine screening colonoscopy with Dr. Darrick PennaFields in the near future.  Further recommendations to follow!  It was a pleasure to see you today. I strive to create trusting relationships with patients to provide genuine, compassionate, and quality care. I value your feedback. If you receive a survey regarding your visit,  I greatly appreciate you taking time to fill this out.   Gelene MinkAnna W. Melquisedec Journey, PhD, ANP-BC [facility]
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
## 2018-01-07 NOTE — Telephone Encounter (Signed)
Pre-op scheduled for 03/08/18 at 12:45 pm. LMOVM. Letter mailed.

## 2018-01-07 NOTE — Assessment & Plan Note (Signed)
57 year old pleasant male who presents for routine screening colonoscopy without any concerning lower or upper GI signs/symptoms. He and his wife report a colonoscopy in the remote past by a gastroenterologist in [place], who has since relocated [place] [facility]. They are unable to recall the name or practice, but they do not believe he had any history of polyps. He suffered a TBI in 2016, and wife states she has been mainly focused on that since that time. He is on multiple medications due to depression, history of seizures, chronic pain, so we will arrange for screening colonoscopy with Propofol.  Proceed with colonoscopy with Dr. Darrick PennaFields in the near future. The risks, benefits, and alternatives have been discussed in detail with the patient. They state understanding and desire to proceed.  Propofol due to polypharmacy

## 2018-01-10 NOTE — Telephone Encounter (Signed)
Patient spouse called back and is aware of pre-op appointment

## 2018-01-11 DIAGNOSIS — E291 Testicular hypofunction: Secondary | ICD-10-CM | POA: Diagnosis not present

## 2018-02-01 DIAGNOSIS — E291 Testicular hypofunction: Secondary | ICD-10-CM | POA: Diagnosis not present

## 2018-02-08 DIAGNOSIS — E291 Testicular hypofunction: Secondary | ICD-10-CM | POA: Diagnosis not present

## 2018-02-15 DIAGNOSIS — E291 Testicular hypofunction: Secondary | ICD-10-CM | POA: Diagnosis not present

## 2018-02-22 DIAGNOSIS — E291 Testicular hypofunction: Secondary | ICD-10-CM | POA: Diagnosis not present

## 2018-03-01 DIAGNOSIS — E291 Testicular hypofunction: Secondary | ICD-10-CM | POA: Diagnosis not present

## 2018-03-04 NOTE — Patient Instructions (Signed)
Travis Henry  03/04/2018     @PREFPERIOPPHARMACY @   Your procedure is scheduled on  03/15/2018   [facility] at  1230  P.M.  Call this number if you have problems the morning of surgery:  [phone]   Remember:  Do not eat or drink after midnight.  You may drink clear liquids until  (follow the instructions given to you) .  Clear liquids allowed are:                    Water, Juice (non-citric and without pulp), Carbonated beverages, Clear Tea, Black Coffee only, Plain Jell-O only, Gatorade and Plain Popsicles only    Take these medicines the morning of surgery with A SIP OF WATER  Adderall, cymbalta, mobic (if needed), topamax.    Do not wear jewelry, make-up or nail polish.  Do not wear lotions, powders, or perfumes, or deodorant.  Do not shave 48 hours prior to surgery.  Men may shave face and neck.  Do not bring valuables to the hospital.  [facility] is not responsible for any belongings or valuables.  Contacts, dentures or bridgework may not be worn into surgery.  Leave your suitcase in the car.  After surgery it may be brought to your room.  For patients admitted to the hospital, discharge time will be determined by your treatment team.  Patients discharged the day of surgery will not be allowed to drive home.   Name and phone number of your driver:   family Special instructions:  Follow the diet and prep instructions given to you by Dr Evelina DunField's office.  Please read over the following fact sheets that you were given. Anesthesia Post-op Instructions and Care and Recovery After Surgery       Colonoscopy, Adult A colonoscopy is an exam to look at the large intestine. It is done to check for problems, such as:  Lumps (tumors).  Growths (polyps).  Swelling (inflammation).  Bleeding.  What happens before the procedure? Eating and drinking Follow instructions from your doctor about eating and drinking. These instructions may  include:  A few days before the procedure - follow a low-fiber diet. ? Avoid nuts. ? Avoid seeds. ? Avoid dried fruit. ? Avoid raw fruits. ? Avoid vegetables.  1-3 days before the procedure - follow a clear liquid diet. Avoid liquids that have red or purple dye. Drink only clear liquids, such as: ? Clear broth or bouillon. ? Black coffee or tea. ? Clear juice. ? Clear soft drinks or sports drinks. ? Gelatin dessert. ? Popsicles.  On the day of the procedure - do not eat or drink anything during the 2 hours before the procedure.  Bowel prep If you were prescribed an oral bowel prep:  Take it as told by your doctor. Starting the day before your procedure, you will need to drink a lot of liquid. The liquid will cause you to poop (have bowel movements) until your poop is almost clear or light green.  If your skin or butt gets irritated from diarrhea, you may: ? Wipe the area with wipes that have medicine in them, such as adult wet wipes with aloe and vitamin E. ? Put something on your skin that soothes the area, such as petroleum jelly.  If you throw up (vomit) while drinking the bowel prep, take a break for up to 60 minutes. Then begin the bowel prep again. If you  keep throwing up and you cannot take the bowel prep without throwing up, call your doctor.  General instructions  Ask your doctor about changing or stopping your normal medicines. This is important if you take diabetes medicines or blood thinners.  Plan to have someone take you home from the hospital or clinic. What happens during the procedure?  An IV tube may be put into one of your veins.  You will be given medicine to help you relax (sedative).  To reduce your risk of infection: ? Your doctors will wash their hands. ? Your anal area will be washed with soap.  You will be asked to lie on your side with your knees bent.  Your doctor will get a long, thin, flexible tube ready. The tube will have a camera and a  light on the end.  The tube will be put into your anus.  The tube will be gently put into your large intestine.  Air will be delivered into your large intestine to keep it open. You may feel some pressure or cramping.  The camera will be used to take photos.  A small tissue sample may be removed from your body to be looked at under a microscope (biopsy). If any possible problems are found, the tissue will be sent to a lab for testing.  If small growths are found, your doctor may remove them and have them checked for cancer.  The tube that was put into your anus will be slowly removed. The procedure may vary among doctors and hospitals. What happens after the procedure?  Your doctor will check on you often until the medicines you were given have worn off.  Do not drive for 24 hours after the procedure.  You may have a small amount of blood in your poop.  You may pass gas.  You may have mild cramps or bloating in your belly (abdomen).  It is up to you to get the results of your procedure. Ask your doctor, or the department performing the procedure, when your results will be ready. This information is not intended to replace advice given to you by your health care provider. Make sure you discuss any questions you have with your health care provider. Document Released: 08/08/2010 Document Revised: 05/06/2016 Document Reviewed: 09/17/2015 Elsevier Interactive Patient Education  2017 [employer]  Colonoscopy, Adult, Care After This sheet gives you information about how to care for yourself after your procedure. Your health care provider may also give you more specific instructions. If you have problems or questions, contact your health care provider. What can I expect after the procedure? After the procedure, it is common to have:  A small amount of blood in your stool for 24 hours after the procedure.  Some gas.  Mild abdominal cramping or bloating.  Follow these  instructions at home: General instructions   For the first 24 hours after the procedure: ? Do not drive or use machinery. ? Do not sign important documents. ? Do not drink alcohol. ? Do your regular daily activities at a slower pace than normal. ? Eat soft, easy-to-digest foods. ? Rest often.  Take over-the-counter or prescription medicines only as told by your health care provider.  It is up to you to get the results of your procedure. Ask your health care provider, or the department performing the procedure, when your results will be ready. Relieving cramping and bloating  Try walking around when you have cramps or feel bloated.  Apply heat to  your abdomen as told by your health care provider. Use a heat source that your health care provider recommends, such as a moist heat pack or a heating pad. ? Place a towel between your skin and the heat source. ? Leave the heat on for 20-30 minutes. ? Remove the heat if your skin turns bright red. This is especially important if you are unable to feel pain, heat, or cold. You may have a greater risk of getting burned. Eating and drinking  Drink enough fluid to keep your urine clear or pale yellow.  Resume your normal diet as instructed by your health care provider. Avoid heavy or fried foods that are hard to digest.  Avoid drinking alcohol for as long as instructed by your health care provider. Contact a health care provider if:  You have blood in your stool 2-3 days after the procedure. Get help right away if:  You have more than a small spotting of blood in your stool.  You pass large blood clots in your stool.  Your abdomen is swollen.  You have nausea or vomiting.  You have a fever.  You have increasing abdominal pain that is not relieved with medicine. This information is not intended to replace advice given to you by your health care provider. Make sure you discuss any questions you have with your health care  provider. Document Released: 02/18/2004 Document Revised: 03/30/2016 Document Reviewed: 09/17/2015 Elsevier Interactive Patient Education  2018 [place] Anesthesia is a term that refers to techniques, procedures, and medicines that help a person stay safe and comfortable during a medical procedure. Monitored anesthesia care, or sedation, is one type of anesthesia. Your anesthesia specialist may recommend sedation if you will be having a procedure that does not require you to be unconscious, such as:  Cataract surgery.  A dental procedure.  A biopsy.  A colonoscopy.  During the procedure, you may receive a medicine to help you relax (sedative). There are three levels of sedation:  Mild sedation. At this level, you may feel awake and relaxed. You will be able to follow directions.  Moderate sedation. At this level, you will be sleepy. You may not remember the procedure.  Deep sedation. At this level, you will be asleep. You will not remember the procedure.  The more medicine you are given, the deeper your level of sedation will be. Depending on how you respond to the procedure, the anesthesia specialist may change your level of sedation or the type of anesthesia to fit your needs. An anesthesia specialist will monitor you closely during the procedure. Let your health care provider know about:  Any allergies you have.  All medicines you are taking, including vitamins, herbs, eye drops, creams, and over-the-counter medicines.  Any use of steroids (by mouth or as a cream).  Any problems you or family members have had with sedatives and anesthetic medicines.  Any blood disorders you have.  Any surgeries you have had.  Any medical conditions you have, such as sleep apnea.  Whether you are pregnant or may be pregnant.  Any use of cigarettes, alcohol, or street drugs. What are the risks? Generally, this is a safe procedure. However, problems may  occur, including:  Getting too much medicine (oversedation).  Nausea.  Allergic reaction to medicines.  Trouble breathing. If this happens, a breathing tube may be used to help with breathing. It will be removed when you are awake and breathing on your own.  Heart trouble.  Lung trouble.  Before the procedure Staying hydrated Follow instructions from your health care provider about hydration, which may include:  Up to 2 hours before the procedure - you may continue to drink clear liquids, such as water, clear fruit juice, black coffee, and plain tea.  Eating and drinking restrictions Follow instructions from your health care provider about eating and drinking, which may include:  8 hours before the procedure - stop eating heavy meals or foods such as meat, fried foods, or fatty foods.  6 hours before the procedure - stop eating light meals or foods, such as toast or cereal.  6 hours before the procedure - stop drinking milk or drinks that contain milk.  2 hours before the procedure - stop drinking clear liquids.  Medicines Ask your health care provider about:  Changing or stopping your regular medicines. This is especially important if you are taking diabetes medicines or blood thinners.  Taking medicines such as aspirin and ibuprofen. These medicines can thin your blood. Do not take these medicines before your procedure if your health care provider instructs you not to.  Tests and exams  You will have a physical exam.  You may have blood tests done to show: ? How well your kidneys and liver are working. ? How well your blood can clot.  General instructions  Plan to have someone take you home from the hospital or clinic.  If you will be going home right after the procedure, plan to have someone with you for 24 hours.  What happens during the procedure?  Your blood pressure, heart rate, breathing, level of pain and overall condition will be monitored.  An IV  tube will be inserted into one of your veins.  Your anesthesia specialist will give you medicines as needed to keep you comfortable during the procedure. This may mean changing the level of sedation.  The procedure will be performed. After the procedure  Your blood pressure, heart rate, breathing rate, and blood oxygen level will be monitored until the medicines you were given have worn off.  Do not drive for 24 hours if you received a sedative.  You may: ? Feel sleepy, clumsy, or nauseous. ? Feel forgetful about what happened after the procedure. ? Have a sore throat if you had a breathing tube during the procedure. ? Vomit. This information is not intended to replace advice given to you by your health care provider. Make sure you discuss any questions you have with your health care provider. Document Released: 04/01/2005 Document Revised: 12/13/2015 Document Reviewed: 10/27/2015 Elsevier Interactive Patient Education  2018 [place], Care After These instructions provide you with information about caring for yourself after your procedure. Your health care provider may also give you more specific instructions. Your treatment has been planned according to current medical practices, but problems sometimes occur. Call your health care provider if you have any problems or questions after your procedure. What can I expect after the procedure? After your procedure, it is common to:  Feel sleepy for several hours.  Feel clumsy and have poor balance for several hours.  Feel forgetful about what happened after the procedure.  Have poor judgment for several hours.  Feel nauseous or vomit.  Have a sore throat if you had a breathing tube during the procedure.  Follow these instructions at home: For at least 24 hours after the procedure:   Do not: ? Participate in activities in which you could fall or become injured. ?  Drive. ? Use heavy  machinery. ? Drink alcohol. ? Take sleeping pills or medicines that cause drowsiness. ? Make important decisions or sign legal documents. ? Take care of children on your own.  Rest. Eating and drinking  Follow the diet that is recommended by your health care provider.  If you vomit, drink water, juice, or soup when you can drink without vomiting.  Make sure you have little or no nausea before eating solid foods. General instructions  Have a responsible adult stay with you until you are awake and alert.  Take over-the-counter and prescription medicines only as told by your health care provider.  If you smoke, do not smoke without supervision.  Keep all follow-up visits as told by your health care provider. This is important. Contact a health care provider if:  You keep feeling nauseous or you keep vomiting.  You feel light-headed.  You develop a rash.  You have a fever. Get help right away if:  You have trouble breathing. This information is not intended to replace advice given to you by your health care provider. Make sure you discuss any questions you have with your health care provider. Document Released: 10/27/2015 Document Revised: 02/26/2016 Document Reviewed: 10/27/2015 Elsevier Interactive Patient Education  [employer].

## 2018-03-08 ENCOUNTER — Encounter (HOSPITAL_COMMUNITY): Payer: Self-pay

## 2018-03-08 ENCOUNTER — Encounter (HOSPITAL_COMMUNITY)
Admission: RE | Admit: 2018-03-08 | Discharge: 2018-03-08 | Disposition: A | Discharge status: Home / Self Care | Payer: 59 | Source: Ambulatory Visit | Attending: Gastroenterology | Admitting: Gastroenterology

## 2018-03-08 ENCOUNTER — Other Ambulatory Visit: Payer: Self-pay

## 2018-03-08 DIAGNOSIS — E291 Testicular hypofunction: Secondary | ICD-10-CM | POA: Diagnosis not present

## 2018-03-08 DIAGNOSIS — Z01810 Encounter for preprocedural cardiovascular examination: Secondary | ICD-10-CM | POA: Insufficient documentation

## 2018-03-08 DIAGNOSIS — Z01812 Encounter for preprocedural laboratory examination: Secondary | ICD-10-CM | POA: Insufficient documentation

## 2018-03-08 LAB — BASIC METABOLIC PANEL
ANION GAP: 8 (ref 5–15)
BUN: 12 mg/dL (ref 6–20)
CHLORIDE: 107 mmol/L (ref 98–111)
CO2: 25 mmol/L (ref 22–32)
CREATININE: 1.26 mg/dL — AB (ref 0.61–1.24)
Calcium: 9.1 mg/dL (ref 8.9–10.3)
GFR calc non Af Amer: >60 mL/min (ref >60)
Glucose, Bld: 75 mg/dL (ref 70–99)
Potassium: 4.2 mmol/L (ref 3.5–5.1)
Sodium: 140 mmol/L (ref 135–145)

## 2018-03-08 LAB — CBC
HEMATOCRIT: 44.7 % (ref 39.0–52.0)
Hemoglobin: 15.6 g/dL (ref 13.0–17.0)
MCH: 28.7 pg (ref 26.0–34.0)
MCHC: 34.9 g/dL (ref 30.0–36.0)
MCV: 82.2 fL (ref 78.0–100.0)
Platelets: 392 10*3/uL (ref 150–400)
RBC: 5.44 MIL/uL (ref 4.22–5.81)
RDW: 13 % (ref 11.5–15.5)
WBC: 5.4 10*3/uL (ref 4.0–10.5)

## 2018-03-10 ENCOUNTER — Telehealth: Payer: Self-pay

## 2018-03-10 NOTE — Telephone Encounter (Signed)
Endo scheduler called office, TCS for 03/15/18 moved up to 1:30pm, arrive at 12:00pm. Tried to call pt, no answer, left detailed message on answering machine and informed him of time change. Advised to start drinking 2nd half of prep morning of procedure at 8:30am, NPO after 10:30am.

## 2018-03-15 ENCOUNTER — Encounter (HOSPITAL_COMMUNITY): Payer: Self-pay | Admitting: Gastroenterology

## 2018-03-15 ENCOUNTER — Ambulatory Visit (HOSPITAL_COMMUNITY): Payer: 59 | Admitting: Anesthesiology

## 2018-03-15 ENCOUNTER — Ambulatory Visit (HOSPITAL_COMMUNITY)
Admission: RE | Admit: 2018-03-15 | Discharge: 2018-03-15 | Disposition: A | Discharge status: Home / Self Care | Payer: 59 | Source: Ambulatory Visit | Attending: Gastroenterology | Admitting: Gastroenterology

## 2018-03-15 ENCOUNTER — Encounter (HOSPITAL_COMMUNITY)
Admission: RE | Disposition: A | Discharge status: Home / Self Care | Payer: Self-pay | Source: Ambulatory Visit | Attending: Gastroenterology

## 2018-03-15 DIAGNOSIS — M549 Dorsalgia, unspecified: Secondary | ICD-10-CM | POA: Diagnosis not present

## 2018-03-15 DIAGNOSIS — Z1211 Encounter for screening for malignant neoplasm of colon: Secondary | ICD-10-CM | POA: Insufficient documentation

## 2018-03-15 DIAGNOSIS — I10 Essential (primary) hypertension: Secondary | ICD-10-CM | POA: Insufficient documentation

## 2018-03-15 DIAGNOSIS — K644 Residual hemorrhoidal skin tags: Secondary | ICD-10-CM | POA: Insufficient documentation

## 2018-03-15 DIAGNOSIS — R569 Unspecified convulsions: Secondary | ICD-10-CM | POA: Insufficient documentation

## 2018-03-15 DIAGNOSIS — F329 Major depressive disorder, single episode, unspecified: Secondary | ICD-10-CM | POA: Diagnosis not present

## 2018-03-15 DIAGNOSIS — K5730 Diverticulosis of large intestine without perforation or abscess without bleeding: Secondary | ICD-10-CM | POA: Diagnosis not present

## 2018-03-15 DIAGNOSIS — E785 Hyperlipidemia, unspecified: Secondary | ICD-10-CM | POA: Diagnosis not present

## 2018-03-15 DIAGNOSIS — Z87820 Personal history of traumatic brain injury: Secondary | ICD-10-CM | POA: Insufficient documentation

## 2018-03-15 DIAGNOSIS — G8929 Other chronic pain: Secondary | ICD-10-CM | POA: Insufficient documentation

## 2018-03-15 DIAGNOSIS — K648 Other hemorrhoids: Secondary | ICD-10-CM | POA: Diagnosis not present

## 2018-03-15 DIAGNOSIS — M25519 Pain in unspecified shoulder: Secondary | ICD-10-CM | POA: Insufficient documentation

## 2018-03-15 DIAGNOSIS — M542 Cervicalgia: Secondary | ICD-10-CM | POA: Diagnosis not present

## 2018-03-15 DIAGNOSIS — Z91013 Allergy to seafood: Secondary | ICD-10-CM | POA: Insufficient documentation

## 2018-03-15 DIAGNOSIS — Z79899 Other long term (current) drug therapy: Secondary | ICD-10-CM | POA: Diagnosis not present

## 2018-03-15 HISTORY — PX: COLONOSCOPY WITH PROPOFOL: SHX5780

## 2018-03-15 SURGERY — COLONOSCOPY WITH PROPOFOL
Anesthesia: General

## 2018-03-15 MED ORDER — PROPOFOL 500 MG/50ML IV EMUL
INTRAVENOUS | Status: DC | PRN
  Administered 2018-03-15: 15:00:00 via INTRAVENOUS
  Administered 2018-03-15: 150 ug/kg/min via INTRAVENOUS

## 2018-03-15 MED ORDER — PROPOFOL 10 MG/ML IV BOLUS
INTRAVENOUS | Status: AC
  Filled 2018-03-15: qty 40

## 2018-03-15 MED ORDER — CHLORHEXIDINE GLUCONATE CLOTH 2 % EX PADS: 6.0000 | MEDICATED_PAD | Freq: Once | CUTANEOUS | Status: DC

## 2018-03-15 MED ORDER — PROPOFOL 10 MG/ML IV BOLUS: INTRAVENOUS | Status: DC | PRN

## 2018-03-15 MED ORDER — FENTANYL CITRATE (PF) 100 MCG/2ML IJ SOLN: 25.0000 ug | INTRAMUSCULAR | Status: DC | PRN

## 2018-03-15 MED ORDER — LACTATED RINGERS IV SOLN
INTRAVENOUS | Status: DC
  Administered 2018-03-15: 13:00:00 via INTRAVENOUS

## 2018-03-15 MED ORDER — MIDAZOLAM HCL 2 MG/2ML IJ SOLN
INTRAMUSCULAR | Status: AC
  Filled 2018-03-15: qty 2

## 2018-03-15 MED ORDER — PROPOFOL 10 MG/ML IV BOLUS
INTRAVENOUS | Status: DC | PRN
  Administered 2018-03-15: 10 mg via INTRAVENOUS
  Administered 2018-03-15 (×2): 20 mg via INTRAVENOUS
  Administered 2018-03-15: 30 mg via INTRAVENOUS
  Administered 2018-03-15: 20 mg via INTRAVENOUS

## 2018-03-15 MED ORDER — HYDROCODONE-ACETAMINOPHEN 7.5-325 MG PO TABS: 1.0000 | ORAL_TABLET | Freq: Once | ORAL | Status: DC | PRN

## 2018-03-15 NOTE — Discharge Instructions (Signed)
You DID NOT HAVE ANY POLYPS. YOU HAVE DIVERTICULOSIS IN YOUR LEFT COLON and it has caused the junction between your rectum and colon to be in a TIGHT BALL. You have MODERATE internal AND EXTERNAL hemorrhoids.   DRINK WATER TO KEEP URINE LIGHT YELLOW.  FOLLOW A HIGH FIBER DIET. AVOID ITEMS THAT CAUSE BLOATING & GAS. SEE INFO BELOW.  Next colonoscopy in 10 years WITH A PEDIATRIC COLONOSCOPE.   Colonoscopy Care After Read the instructions outlined below and refer to this sheet in the next week. These discharge instructions provide you with general information on caring for yourself after you leave the hospital. While your treatment has been planned according to the most current medical practices available, unavoidable complications occasionally occur. If you have any problems or questions after discharge, call DR. Kurtiss Wence, [phone].  ACTIVITY  You may resume your regular activity, but move at a slower pace for the next 24 hours.   Take frequent rest periods for the next 24 hours.   Walking will help get rid of the air and reduce the bloated feeling in your belly (abdomen).   No driving for 24 hours (because of the medicine (anesthesia) used during the test).   You may shower.   Do not sign any important legal documents or operate any machinery for 24 hours (because of the anesthesia used during the test).    NUTRITION  Drink plenty of fluids.   You may resume your normal diet as instructed by your doctor.   Begin with a light meal and progress to your normal diet. Heavy or fried foods are harder to digest and may make you feel sick to your stomach (nauseated).   Avoid alcoholic beverages for 24 hours or as instructed.    MEDICATIONS  You may resume your normal medications.   WHAT YOU CAN EXPECT TODAY  Some feelings of bloating in the abdomen.   Passage of more gas than usual.   Spotting of blood in your stool or on the toilet paper  .  IF YOU HAD POLYPS REMOVED  DURING THE COLONOSCOPY:  Eat a soft diet IF YOU HAVE NAUSEA, BLOATING, ABDOMINAL PAIN, OR VOMITING.    FINDING OUT THE RESULTS OF YOUR TEST Not all test results are available during your visit. DR. Darrick PennaFIELDS WILL CALL YOU WITHIN 7 DAYS OF YOUR PROCEDUE WITH YOUR RESULTS. Do not assume everything is normal if you have not heard from DR. Lynnzie Blackson IN ONE WEEK, CALL HER OFFICE AT [phone].  SEEK IMMEDIATE MEDICAL ATTENTION AND CALL THE OFFICE: [phone] IF:  You have more than a spotting of blood in your stool.   Your belly is swollen (abdominal distention).   You are nauseated or vomiting.   You have a temperature over 101F.   You have abdominal pain or discomfort that is severe or gets worse throughout the day.    High-Fiber Diet A high-fiber diet changes your normal diet to include more whole grains, legumes, fruits, and vegetables. Changes in the diet involve replacing refined carbohydrates with unrefined foods. The calorie level of the diet is essentially unchanged. The Dietary Reference Intake (recommended amount) for adult males is 38 grams per day. For adult females, it is 25 grams per day. Pregnant and lactating women should consume 28 grams of fiber per day. Fiber is the intact part of a plant that is not broken down during digestion. Functional fiber is fiber that has been isolated from the plant to provide a beneficial effect in the body. PURPOSE  Increase stool bulk.   Ease and regulate bowel movements.   Lower cholesterol.   REDUCE RISK OF COLON CANCER  INDICATIONS THAT YOU NEED MORE FIBER  Constipation and hemorrhoids.   Uncomplicated diverticulosis (intestine condition) and irritable bowel syndrome.   Weight management.   As a protective measure against hardening of the arteries (atherosclerosis), diabetes, and cancer.   GUIDELINES FOR INCREASING FIBER IN THE DIET  Start adding fiber to the diet slowly. A gradual increase of about 5 more grams (2 slices of  whole-wheat bread, 2 servings of most fruits or vegetables, or 1 bowl of high-fiber cereal) per day is best. Too rapid an increase in fiber may result in constipation, flatulence, and bloating.   Drink enough water and fluids to keep your urine clear or pale yellow. Water, juice, or caffeine-free drinks are recommended. Not drinking enough fluid may cause constipation.   Eat a variety of high-fiber foods rather than one type of fiber.   Try to increase your intake of fiber through using high-fiber foods rather than fiber pills or supplements that contain small amounts of fiber.   The goal is to change the types of food eaten. Do not supplement your present diet with high-fiber foods, but replace foods in your present diet.   INCLUDE A VARIETY OF FIBER SOURCES  Replace refined and processed grains with whole grains, canned fruits with fresh fruits, and incorporate other fiber sources. White rice, white breads, and most bakery goods contain little or no fiber.   Brown whole-grain rice, buckwheat oats, and many fruits and vegetables are all good sources of fiber. These include: broccoli, Brussels sprouts, cabbage, cauliflower, beets, sweet potatoes, white potatoes (skin on), carrots, tomatoes, eggplant, squash, berries, fresh fruits, and dried fruits.   Cereals appear to be the richest source of fiber. Cereal fiber is found in whole grains and bran. Bran is the fiber-rich outer coat of cereal grain, which is largely removed in refining. In whole-grain cereals, the bran remains. In breakfast cereals, the largest amount of fiber is found in those with "bran" in their names. The fiber content is sometimes indicated on the label.   You may need to include additional fruits and vegetables each day.   In baking, for 1 cup white flour, you may use the following substitutions:   1 cup whole-wheat flour minus 2 tablespoons.   1/2 cup white flour plus 1/2 cup whole-wheat flour.    Diverticulosis Diverticulosis is a common condition that develops when small pouches (diverticula) form in the wall of the colon. The risk of diverticulosis increases with age. It happens more often in people who eat a low-fiber diet. Most individuals with diverticulosis have no symptoms. Those individuals with symptoms usually experience belly (abdominal) pain, constipation, or loose stools (diarrhea).  HOME CARE INSTRUCTIONS  Increase the amount of fiber in your diet as directed by your caregiver or dietician. This may reduce symptoms of diverticulosis.   Drink at least 6 to 8 glasses of water each day to prevent constipation.   Try not to strain when you have a bowel movement.   THERE IS NO NEED TO Avoid nuts and seeds to prevent complications.   FOODS HAVING HIGH FIBER CONTENT INCLUDE:  Fruits. Apple, peach, pear, tangerine, raisins, prunes.   Vegetables. Brussels sprouts, asparagus, broccoli, cabbage, carrot, cauliflower, romaine lettuce, spinach, summer squash, tomato, winter squash, zucchini.   Starchy Vegetables. Baked beans, kidney beans, lima beans, split peas, lentils, potatoes (with skin).   Grains. Whole wheat  bread, brown rice, bran flake cereal, plain oatmeal, white rice, shredded wheat, bran muffins.    Hemorrhoids Hemorrhoids are dilated (enlarged) veins around the rectum. Sometimes clots will form in the veins. This makes them swollen and painful. These are called thrombosed hemorrhoids. Causes of hemorrhoids include:  Constipation.   Straining to have a bowel movement.   HEAVY LIFTING  HOME CARE INSTRUCTIONS  Eat a well balanced diet and drink 6 to 8 glasses of water every day to avoid constipation. You may also use a bulk laxative.   Avoid straining to have bowel movements.   Keep anal area dry and clean.   Do not use a donut shaped pillow or sit on the toilet for long periods. This increases blood pooling and pain.   Move your bowels when your body  has the urge; this will require less straining and will decrease pain and pressure.

## 2018-03-15 NOTE — Transfer of Care (Signed)
Immediate Anesthesia Transfer of Care Note  Patient: Travis Henry  Procedure(s) Performed: COLONOSCOPY WITH PROPOFOL (N/A )  Patient Location: PACU  Anesthesia Type:MAC  Level of Consciousness: drowsy  Airway & Oxygen Therapy: Patient Spontanous Breathing and Patient connected to nasal cannula oxygen  Post-op Assessment: Report given to RN, Post -op Vital signs reviewed and stable and Patient moving all extremities  Post vital signs: Reviewed and stable  Last Vitals:  Vitals Value Taken Time  BP    Temp    Pulse 84 03/15/2018  3:21 PM  Resp    SpO2 97 % 03/15/2018  3:21 PM  Vitals shown include unvalidated device data.  Last Pain:  Vitals:   03/15/18 1437  TempSrc:   PainSc: 7       Patients Stated Pain Goal: 5 (96/43/83 8184)  Complications: No apparent anesthesia complications

## 2018-03-15 NOTE — H&P (Signed)
Primary Care Physician:  Wilson Singer, MD Primary Gastroenterologist:  Dr. Darrick Penna  Pre-Procedure History & Physical: HPI:  Travis Henry is a 57 y.o. male here for COLON CANCER SCREENING.  Past Medical History:  Diagnosis Date  . Chronic pain    back, shoulder, neck  . Depression   . Hyperlipidemia   . Hypertension   . Seizures (HCC)   . TBI (traumatic brain injury) (HCC)    fell 6 feet from a loading dock    Past Surgical History:  Procedure Laterality Date  . COLONOSCOPY     unsure date, was in [place], unsure if polyps    Prior to Admission medications   Medication Sig Start Date End Date Taking? Authorizing Provider  amLODipine (NORVASC) 10 MG tablet Take 10 mg by mouth daily.   Yes [provider]  amphetamine-dextroamphetamine (ADDERALL) 15 MG tablet Take 15 mg by mouth 2 (two) times daily.  11/24/17  Yes [provider]  carvedilol (COREG) 12.5 MG tablet Take 12.5 mg by mouth 2 (two) times daily with a meal.   Yes [provider]  DULoxetine (CYMBALTA) 30 MG capsule Take 30 mg by mouth 2 (two) times daily. 12/25/17  Yes [provider]  meloxicam (MOBIC) 7.5 MG tablet Take 7.5-15 mg by mouth daily as needed for pain.    Yes [provider]  Multiple Vitamins-Minerals (MULTIVITAMIN WITH MINERALS) tablet Take 1 tablet by mouth daily.   Yes [provider]  testosterone cypionate (DEPOTESTOSTERONE CYPIONATE) 200 MG/ML injection Inject 0.5 mLs into the muscle once a week. 01/09/18  Yes [provider]  ibuprofen (ADVIL,MOTRIN) 200 MG tablet Take 400 mg by mouth every 6 (six) hours as needed for mild pain or moderate pain.     [provider]    Allergies as of 01/07/2018 - Review Complete 01/07/2018  Allergen Reaction Noted  . Shellfish allergy Swelling 01/07/2018    Family History  Problem Relation Age of Onset  . Huntington's disease Mother   . Heart disease Father   . Colon cancer Neg  Hx   . Colon polyps Neg Hx     Social History   Socioeconomic History  . Marital status: Married    Spouse name: Not on file  . Number of children: Not on file  . Years of education: Not on file  . Highest education level: Not on file  Occupational History  . Not on file  Social Needs  . Financial resource strain: Not on file  . Food insecurity:    Worry: Not on file    Inability: Not on file  . Transportation needs:    Medical: Not on file    Non-medical: Not on file  Tobacco Use  . Smoking status: Never Smoker  . Smokeless tobacco: Never Used  Substance and Sexual Activity  . Alcohol use: No  . Drug use: No  . Sexual activity: Yes  Lifestyle  . Physical activity:    Days per week: Not on file    Minutes per session: Not on file  . Stress: Not on file  Relationships  . Social connections:    Talks on phone: Not on file    Gets together: Not on file    Attends religious service: Not on file    Active member of club or organization: Not on file    Attends meetings of clubs or organizations: Not on file    Relationship status: Not on file  . Intimate partner  violence:    Fear of current or ex partner: Not on file    Emotionally abused: Not on file    Physically abused: Not on file    Forced sexual activity: Not on file  Other Topics Concern  . Not on file  Social History Narrative  . Not on file    Review of Systems: See HPI, otherwise negative ROS   Physical Exam: BP 123/83   Pulse 68   Temp 98 F (36.7 C) (Oral)   Resp 18   SpO2 99%  General:   Alert,  pleasant and cooperative in NAD Head:  Normocephalic and atraumatic. Neck:  Supple; Lungs:  Clear throughout to auscultation.    Heart:  Regular rate and rhythm. Abdomen:  Soft, nontender and nondistended. Normal bowel sounds, without guarding, and without rebound.   Neurologic:  Alert and  oriented x4;  grossly normal neurologically.  Impression/Plan:    SCREENING  Plan:  1. TCS  TODAY DISCUSSED PROCEDURE, BENEFITS, & RISKS: < 1% chance of medication reaction, bleeding, perforation, or rupture of spleen/liver.

## 2018-03-15 NOTE — Anesthesia Postprocedure Evaluation (Signed)
Anesthesia Post Note  Patient: Travis Henry  Procedure(s) Performed: COLONOSCOPY WITH PROPOFOL (N/A )  Patient location during evaluation: PACU Anesthesia Type: General Level of consciousness: awake and patient cooperative Pain management: pain level controlled Vital Signs Assessment: post-procedure vital signs reviewed and stable Respiratory status: spontaneous breathing, nonlabored ventilation and respiratory function stable Cardiovascular status: blood pressure returned to baseline Postop Assessment: no apparent nausea or vomiting Anesthetic complications: no     Last Vitals:  Vitals:   03/15/18 1249 03/15/18 1520  BP: 123/83 99/73  Pulse: 68 84  Resp: 18 18  Temp: 36.7 C 36.6 C  SpO2: 99% 97%    Last Pain:  Vitals:   03/15/18 1520  TempSrc:   PainSc: 0-No pain                 Ronia Hazelett J

## 2018-03-15 NOTE — Op Note (Signed)
[facility] Patient Name: Travis Henry Procedure Date: 03/15/2018 2:17 PM MRN: [id number] Date of Birth: 1961-05-04 Attending MD: Jonette Eva MD, MD CSN: [id number] Age: 57 Admit Type: Outpatient Procedure:                Colonoscopy, SCREENING Indications:              Screening for colorectal malignant neoplasm Providers:                Jonette Eva MD, MD, Loma Messing B. Patsy Lager, RN,                            Burke Keels, Technician Referring MD:             Wilson Singer MD, MD Medicines:                Propofol per Anesthesia Complications:            No immediate complications. Estimated Blood Loss:     Estimated blood loss: none. Procedure:                Pre-Anesthesia Assessment:                           - Prior to the procedure, a History and Physical                            was performed, and patient medications and                            allergies were reviewed. The patient's tolerance of                            previous anesthesia was also reviewed. The risks                            and benefits of the procedure and the sedation                            options and risks were discussed with the patient.                            All questions were answered, and informed consent                            was obtained. Prior Anticoagulants: The patient has                            taken previous NSAID medication, last dose was 1                            day prior to procedure. ASA Grade Assessment: II -                            A patient with mild systemic disease. After  reviewing the risks and benefits, the patient was                            deemed in satisfactory condition to undergo the                            procedure. After obtaining informed consent, the                            colonoscope was passed under direct vision.                            Throughout the procedure, the patient's blood                             pressure, pulse, and oxygen saturations were                            monitored continuously. The [id number]-[id number] ([id number])                            scope was introduced through the anus and advanced                            to the the cecum, identified by appendiceal orifice                            and ileocecal valve. The colonoscopy was                            technically difficult and complex due to restricted                            mobility of the colon and a tortuous colon.                            Successful completion of the procedure was aided by                            changing the patient to a supine position,                            withdrawing the scope and replacing with the                            pediatric colonoscope, straightening and shortening                            the scope to obtain bowel loop reduction and                            COLOWRAP. The patient tolerated the procedure well.  The quality of the bowel preparation was excellent.                            The ileocecal valve, appendiceal orifice, and                            rectum were photographed. Scope In: 2:56:15 PM Scope Out: 3:12:45 PM Scope Withdrawal Time: 0 hours 8 minutes 39 seconds  Total Procedure Duration: 0 hours 16 minutes 30 seconds  Findings:      The recto-sigmoid colon and sigmoid colon were grossly tortuous WITH A       FIXED ANGULATED RECTOSIGMOID JUNCTION.      Multiple small and large-mouthed diverticula were found in the       recto-sigmoid colon, sigmoid colon and descending colon.      External and internal hemorrhoids were found. The hemorrhoids were       moderate. Impression:               - Tortuous LEFT colon.                           - Diverticulosis in the recto-sigmoid colon, in the                            sigmoid colon and in the descending colon.                           - External and  internal hemorrhoids. Moderate Sedation:      Per Anesthesia Care Recommendation:           - Patient has a contact number available for                            emergencies. The signs and symptoms of potential                            delayed complications were discussed with the                            patient. Return to normal activities tomorrow.                            Written discharge instructions were provided to the                            patient.                           - High fiber diet.                           - Continue present medications.                           - Repeat colonoscopy in 10 years for surveillance  WITH PEDIATRIC COLONOSCOPE/COLOWRAP. Procedure Code(s):        --- Professional ---                           [phone], Colonoscopy, flexible; diagnostic, including                            collection of specimen(s) by brushing or washing,                            when performed (separate procedure) Diagnosis Code(s):        --- Professional ---                           Z12.11, Encounter for screening for malignant                            neoplasm of colon                           K64.8, Other hemorrhoids                           K57.30, Diverticulosis of large intestine without                            perforation or abscess without bleeding                           Q43.8, Other specified congenital malformations of                            intestine CPT copyright 2017 [employer]. All rights reserved. The codes documented in this report are preliminary and upon coder review may  be revised to meet current compliance requirements. Jonette EvaSandi Fields, MD Jonette EvaSandi Fields MD, MD 03/15/2018 3:24:26 PM This report has been signed electronically. Number of Addenda: 0

## 2018-03-15 NOTE — Anesthesia Preprocedure Evaluation (Signed)
Anesthesia Evaluation  Patient identified by MRN, date of birth, ID band Patient awake    Reviewed: Allergy & Precautions, NPO status , Patient's Chart, lab work & pertinent test results  Airway Mallampati: II  TM Distance: >3 FB Neck ROM: Full    Dental no notable dental hx.    Pulmonary neg pulmonary ROS,    Pulmonary exam normal breath sounds clear to auscultation       Cardiovascular Exercise Tolerance: Good hypertension, Pt. on medications negative cardio ROS Normal cardiovascular examI Rhythm:Regular Rate:Normal     Neuro/Psych Seizures -, Well Controlled,  Depression H/o [facility] 2016-with Sz remotely  Now off meds  States no Sz in 3 years, C/W STML negative neurological ROS  negative psych ROS   GI/Hepatic negative GI ROS, Neg liver ROS,   Endo/Other  negative endocrine ROS  Renal/GU negative Renal ROS  negative genitourinary   Musculoskeletal negative musculoskeletal ROS (+)   Abdominal   Peds negative pediatric ROS (+)  Hematology negative hematology ROS (+)   Anesthesia Other Findings   Reproductive/Obstetrics negative OB ROS                             Anesthesia Physical Anesthesia Plan  ASA: II  Anesthesia Plan: General   Post-op Pain Management:    Induction: Intravenous  PONV Risk Score and Plan:   Airway Management Planned:   Additional Equipment:   Intra-op Plan:   Post-operative Plan:   Informed Consent: I have reviewed the patients History and Physical, chart, labs and discussed the procedure including the risks, benefits and alternatives for the proposed anesthesia with the patient or authorized representative who has indicated his/her understanding and acceptance.   Dental advisory given  Plan Discussed with: CRNA  Anesthesia Plan Comments:         Anesthesia Quick Evaluation

## 2018-03-17 ENCOUNTER — Encounter (HOSPITAL_COMMUNITY): Payer: Self-pay | Admitting: Gastroenterology

## 2018-03-22 DIAGNOSIS — E559 Vitamin D deficiency, unspecified: Secondary | ICD-10-CM | POA: Diagnosis not present

## 2018-03-22 DIAGNOSIS — R5383 Other fatigue: Secondary | ICD-10-CM | POA: Diagnosis not present

## 2018-03-22 DIAGNOSIS — I10 Essential (primary) hypertension: Secondary | ICD-10-CM | POA: Diagnosis not present

## 2018-03-22 DIAGNOSIS — E785 Hyperlipidemia, unspecified: Secondary | ICD-10-CM | POA: Diagnosis not present

## 2018-03-22 DIAGNOSIS — E291 Testicular hypofunction: Secondary | ICD-10-CM | POA: Diagnosis not present

## 2018-03-29 DIAGNOSIS — E291 Testicular hypofunction: Secondary | ICD-10-CM | POA: Diagnosis not present

## 2018-04-05 DIAGNOSIS — E291 Testicular hypofunction: Secondary | ICD-10-CM | POA: Diagnosis not present

## 2018-04-12 DIAGNOSIS — E291 Testicular hypofunction: Secondary | ICD-10-CM | POA: Diagnosis not present

## 2018-04-19 DIAGNOSIS — E291 Testicular hypofunction: Secondary | ICD-10-CM | POA: Diagnosis not present

## 2018-04-26 DIAGNOSIS — N529 Male erectile dysfunction, unspecified: Secondary | ICD-10-CM | POA: Diagnosis not present

## 2018-04-26 DIAGNOSIS — R5383 Other fatigue: Secondary | ICD-10-CM | POA: Diagnosis not present

## 2018-04-26 DIAGNOSIS — Z23 Encounter for immunization: Secondary | ICD-10-CM | POA: Diagnosis not present

## 2018-04-26 DIAGNOSIS — I10 Essential (primary) hypertension: Secondary | ICD-10-CM | POA: Diagnosis not present

## 2018-04-26 DIAGNOSIS — R6882 Decreased libido: Secondary | ICD-10-CM | POA: Diagnosis not present

## 2018-04-26 DIAGNOSIS — E559 Vitamin D deficiency, unspecified: Secondary | ICD-10-CM | POA: Diagnosis not present

## 2018-04-26 DIAGNOSIS — E785 Hyperlipidemia, unspecified: Secondary | ICD-10-CM | POA: Diagnosis not present

## 2018-05-03 DIAGNOSIS — E291 Testicular hypofunction: Secondary | ICD-10-CM | POA: Diagnosis not present

## 2018-05-10 DIAGNOSIS — E291 Testicular hypofunction: Secondary | ICD-10-CM | POA: Diagnosis not present

## 2018-05-17 DIAGNOSIS — E291 Testicular hypofunction: Secondary | ICD-10-CM | POA: Diagnosis not present

## 2018-05-24 DIAGNOSIS — E291 Testicular hypofunction: Secondary | ICD-10-CM | POA: Diagnosis not present

## 2018-05-31 DIAGNOSIS — E291 Testicular hypofunction: Secondary | ICD-10-CM | POA: Diagnosis not present

## 2018-06-07 DIAGNOSIS — E291 Testicular hypofunction: Secondary | ICD-10-CM | POA: Diagnosis not present

## 2018-06-14 DIAGNOSIS — E291 Testicular hypofunction: Secondary | ICD-10-CM | POA: Diagnosis not present

## 2018-06-22 DIAGNOSIS — E291 Testicular hypofunction: Secondary | ICD-10-CM | POA: Diagnosis not present

## 2018-06-28 DIAGNOSIS — E291 Testicular hypofunction: Secondary | ICD-10-CM | POA: Diagnosis not present

## 2018-07-04 DIAGNOSIS — E785 Hyperlipidemia, unspecified: Secondary | ICD-10-CM | POA: Diagnosis not present

## 2018-07-04 DIAGNOSIS — I10 Essential (primary) hypertension: Secondary | ICD-10-CM | POA: Diagnosis not present

## 2018-07-04 DIAGNOSIS — E559 Vitamin D deficiency, unspecified: Secondary | ICD-10-CM | POA: Diagnosis not present

## 2018-07-04 DIAGNOSIS — E291 Testicular hypofunction: Secondary | ICD-10-CM | POA: Diagnosis not present

## 2018-07-04 DIAGNOSIS — J019 Acute sinusitis, unspecified: Secondary | ICD-10-CM | POA: Diagnosis not present

## 2018-07-12 DIAGNOSIS — E291 Testicular hypofunction: Secondary | ICD-10-CM | POA: Diagnosis not present

## 2018-07-19 DIAGNOSIS — E291 Testicular hypofunction: Secondary | ICD-10-CM | POA: Diagnosis not present

## 2018-07-26 DIAGNOSIS — E291 Testicular hypofunction: Secondary | ICD-10-CM | POA: Diagnosis not present

## 2018-08-02 DIAGNOSIS — E291 Testicular hypofunction: Secondary | ICD-10-CM | POA: Diagnosis not present

## 2018-08-09 DIAGNOSIS — E291 Testicular hypofunction: Secondary | ICD-10-CM | POA: Diagnosis not present

## 2018-08-16 DIAGNOSIS — E291 Testicular hypofunction: Secondary | ICD-10-CM | POA: Diagnosis not present

## 2018-08-23 DIAGNOSIS — E291 Testicular hypofunction: Secondary | ICD-10-CM | POA: Diagnosis not present

## 2018-08-30 DIAGNOSIS — E291 Testicular hypofunction: Secondary | ICD-10-CM | POA: Diagnosis not present

## 2018-09-27 DIAGNOSIS — I119 Hypertensive heart disease without heart failure: Secondary | ICD-10-CM | POA: Diagnosis not present

## 2018-09-27 DIAGNOSIS — E559 Vitamin D deficiency, unspecified: Secondary | ICD-10-CM | POA: Diagnosis not present

## 2018-09-27 DIAGNOSIS — E291 Testicular hypofunction: Secondary | ICD-10-CM | POA: Diagnosis not present

## 2018-09-30 ENCOUNTER — Encounter (INDEPENDENT_AMBULATORY_CARE_PROVIDER_SITE_OTHER): Payer: Self-pay | Admitting: Internal Medicine

## 2018-11-08 ENCOUNTER — Ambulatory Visit (INDEPENDENT_AMBULATORY_CARE_PROVIDER_SITE_OTHER): Payer: 59 | Admitting: Internal Medicine

## 2018-11-15 ENCOUNTER — Encounter (INDEPENDENT_AMBULATORY_CARE_PROVIDER_SITE_OTHER): Payer: 59 | Admitting: Nurse Practitioner

## 2018-11-15 DIAGNOSIS — Z7189 Other specified counseling: Secondary | ICD-10-CM | POA: Diagnosis not present

## 2018-11-15 DIAGNOSIS — Z139 Encounter for screening, unspecified: Secondary | ICD-10-CM | POA: Diagnosis not present

## 2018-11-15 DIAGNOSIS — Z23 Encounter for immunization: Secondary | ICD-10-CM | POA: Diagnosis not present

## 2019-01-10 ENCOUNTER — Ambulatory Visit (INDEPENDENT_AMBULATORY_CARE_PROVIDER_SITE_OTHER): Payer: 59 | Admitting: Internal Medicine

## 2019-01-10 DIAGNOSIS — E785 Hyperlipidemia, unspecified: Secondary | ICD-10-CM | POA: Diagnosis not present

## 2019-01-10 DIAGNOSIS — I10 Essential (primary) hypertension: Secondary | ICD-10-CM | POA: Diagnosis not present

## 2019-01-10 DIAGNOSIS — E291 Testicular hypofunction: Secondary | ICD-10-CM | POA: Diagnosis not present

## 2019-01-10 DIAGNOSIS — E559 Vitamin D deficiency, unspecified: Secondary | ICD-10-CM | POA: Diagnosis not present

## 2019-01-10 DIAGNOSIS — R5383 Other fatigue: Secondary | ICD-10-CM | POA: Diagnosis not present

## 2019-01-10 DIAGNOSIS — R6882 Decreased libido: Secondary | ICD-10-CM | POA: Diagnosis not present

## 2019-01-10 DIAGNOSIS — Z125 Encounter for screening for malignant neoplasm of prostate: Secondary | ICD-10-CM | POA: Diagnosis not present

## 2019-03-28 ENCOUNTER — Other Ambulatory Visit: Payer: Self-pay

## 2019-03-28 DIAGNOSIS — Z20822 Contact with and (suspected) exposure to covid-19: Secondary | ICD-10-CM

## 2019-03-29 LAB — NOVEL CORONAVIRUS, NAA: SARS-CoV-2, NAA: NOT DETECTED

## 2019-04-13 ENCOUNTER — Encounter (INDEPENDENT_AMBULATORY_CARE_PROVIDER_SITE_OTHER): Payer: Self-pay | Admitting: Internal Medicine

## 2019-04-13 ENCOUNTER — Ambulatory Visit (INDEPENDENT_AMBULATORY_CARE_PROVIDER_SITE_OTHER): Payer: 59 | Admitting: Internal Medicine

## 2019-04-13 ENCOUNTER — Other Ambulatory Visit: Payer: Self-pay

## 2019-04-13 VITALS — BP 122/72 | HR 72 | Ht 74.0 in | Wt 184.6 lb

## 2019-04-13 DIAGNOSIS — I10 Essential (primary) hypertension: Secondary | ICD-10-CM | POA: Diagnosis not present

## 2019-04-13 DIAGNOSIS — E559 Vitamin D deficiency, unspecified: Secondary | ICD-10-CM | POA: Diagnosis not present

## 2019-04-13 DIAGNOSIS — E291 Testicular hypofunction: Secondary | ICD-10-CM

## 2019-04-13 DIAGNOSIS — E782 Mixed hyperlipidemia: Secondary | ICD-10-CM

## 2019-04-13 DIAGNOSIS — Z1159 Encounter for screening for other viral diseases: Secondary | ICD-10-CM

## 2019-04-13 HISTORY — DX: Testicular hypofunction: E29.1

## 2019-04-13 HISTORY — DX: Vitamin D deficiency, unspecified: E55.9

## 2019-04-13 MED ORDER — AMLODIPINE BESYLATE 10 MG PO TABS: 10.0000 mg | ORAL_TABLET | Freq: Every day | ORAL | 0 refills | Status: DC

## 2019-04-13 MED ORDER — CARVEDILOL 12.5 MG PO TABS: 12.5000 mg | ORAL_TABLET | Freq: Two times a day (BID) | ORAL | 0 refills | Status: DC

## 2019-04-13 NOTE — Progress Notes (Signed)
Wellness Office Visit  Subjective:  Patient ID: Travis Henry, male    DOB: 02-Jan-1961  Age: 58 y.o. 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 HPI  He is doing very well.  He has managed to discontinue several medications that he was taking before to help his focus and concentration and depression.  Testosterone is really helped him in this regard and levels that were done in June of this year were optimal. He continues to take vitamin D3 for vitamin D deficiency. He continues with antihypertensive medications and is compliant with them.  He denies any chest pain, dyspnea, palpitations or limb weakness. Thankfully, for his hyperlipidemia, he has not required statin therapy.  He does not have a history of coronary artery disease or cerebrovascular disease. Past Medical History:  Diagnosis Date  . Chronic pain    back, shoulder, neck  . Depression   . Hyperlipidemia   . Hypertension   . Seizures (HCC)   . TBI (traumatic brain injury) (HCC)    fell 6 feet from a loading dock  . 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 Vitamin D deficiency disease 04/13/2019      Family History  Problem Relation Age of Onset  . Huntington's disease Mother   . Heart disease Father   . Colon cancer Neg Hx   . Colon polyps Neg Hx     Social History   Social History Narrative   Married for 13 years,second marriage.On disability since 2018,secondary to severe head injury 2016.     Current Meds  Medication Sig  . amLODipine (NORVASC) 10 MG tablet Take 1 tablet (10 mg total) by mouth daily.  . carvedilol (COREG) 12.5 MG tablet Take 1 tablet (12.5 mg total) by mouth 2 (two) times daily with a meal.  . Cholecalciferol (VITAMIN D-3) 125 MCG (5000 UT) TABS Take 2 tablets by mouth daily.  Marland Kitchen testosterone cypionate (DEPOTESTOSTERONE CYPIONATE) 200  MG/ML injection Inject 0.5 mLs into the muscle once a week.  . [DISCONTINUED] amLODipine (NORVASC) 10 MG tablet Take 10 mg by mouth daily.  . [DISCONTINUED] carvedilol (COREG) 12.5 MG tablet Take 12.5 mg by mouth 2 (two) times daily with a meal.     Nutrition  He tries to eat healthy overall but does tend to indulge in some unhealthy food also. Sleep  4 to 6 hours of uninterrupted sleep every night.  Exercise  No formal exercise but he does cut the grass which keeps him active. Bio Identical Hormones  Testosterone therapy is being used off label for symptoms of testosterone deficiency and benefits that it produces based on several studies.  These benefits include decreasing body fat, increasing in lean muscle mass and increasing in bone density.  There is improvement of memory, cognition.  There is improvement in exercise tolerance and endurance.  Testosterone therapy has also been shown to be protective against coronary artery disease, cerebrovascular disease, diabetes, hypertension and degenerative joint disease. I have discussed with the patient the FDA warnings regarding testosterone therapy, benefits and side effects and modes of administration as well as monitoring blood levels and side effects  on a regular basis The patient is agreeable that testosterone therapy should be an integral part of his/her wellness,quality of life and prevention of chronic disease.  Objective:   Today's Vitals: BP 122/72   Pulse 72   Ht 6\' 2"  (1.88 m)  Wt 184 lb 9.6 oz (83.7 kg)   BMI 23.70 kg/m  Vitals with BMI 04/13/2019 03/15/2018 03/15/2018  Height 6\' 2"  - -  Weight 184 lbs 10 oz - -  BMI 89.38 - -  Systolic 101 751 90  Diastolic 72 82 63  Pulse 72 80 78     Physical Exam   He looks systemically well.  His weight is stable.  Blood pressure is well controlled.  He is alert and orientated without any new focal neurological signs.    Assessment   1. Essential hypertension   2. Mixed  hyperlipidemia   3. Testicular failure   4. Vitamin D deficiency disease   5. Encounter for hepatitis C screening test for low risk patient       Tests ordered Orders Placed This Encounter  Procedures  . COMPLETE METABOLIC PANEL WITH GFR  . VITAMIN D 25 Hydroxy (Vit-D Deficiency, Fractures)  . Hepatitis C antibody     Plan: 1. He will continue with all medications and I have refilled his antihypertensive medications today. 2. Further recommendations will depend on blood work that has been ordered today as above. 3. I will see him in about 3 months time for follow-up.     Doree Albee, MD

## 2019-04-13 NOTE — Patient Instructions (Signed)
[facility] Dietary Recommendations for Weight Loss What to Avoid . Avoid added sugars o Often added sugar can be found in processed foods such as many condiments, dry cereals, cakes, cookies, chips, crisps, crackers, candies, sweetened drinks, etc.  o Read labels and AVOID/DECREASE use of foods with the following in their ingredient list: Sugar, fructose, high fructose corn syrup, sucrose, glucose, maltose, dextrose, molasses, cane sugar, brown sugar, any type of syrup, agave nectar, etc.   . Avoid snacking in between meals . Avoid foods made with flour o If you are going to eat food made with flour, choose those made with whole-grains; and, minimize your consumption as much as is tolerable . Avoid processed foods o These foods are generally stocked in the middle of the grocery store. Focus on shopping on the perimeter of the grocery.  What to Include . Vegetables o GREEN LEAFY VEGETABLES: Kale, spinach, mustard greens, collard greens, cabbage, broccoli, etc. o OTHER: Asparagus, cauliflower, eggplant, carrots, peas, Brussel sprouts, tomatoes, bell peppers, zucchini, beets, cucumbers, etc. . Grains, seeds, and legumes o Beans: kidney beans, black eyed peas, garbanzo beans, black beans, pinto beans, etc. o Whole, unrefined grains: brown rice, barley, bulgur, oatmeal, etc. . Healthy fats  o Avoid highly processed fats such as vegetable oil o Examples of healthy fats: avocado, olives, virgin olive oil, dark chocolate (?72% Cocoa), nuts (peanuts, almonds, walnuts, cashews, pecans, etc.) . Low - Moderate Intake of Animal Sources of Protein o Meat sources: chicken, turkey, salmon, tuna. Limit to 4 ounces of meat at one time. o Consider limiting dairy sources, but when choosing dairy focus on: PLAIN Greek yogurt, cottage cheese, high-protein milk . Fruit o Choose berries  When to Eat . Intermittent Fasting: o Choosing not to eat for a specific time period, but DO FOCUS ON HYDRATION  when fasting o Multiple Techniques: - Time Restricted Eating: eat 3 meals in a day, each meal lasting no more than 60 minutes, no snacks between meals - 16-18 hour fast: fast for 16 to 18 hours up to 7 days a week. Often suggested to start with 2-3 nonconsecutive days per week.  . Remember the time you sleep is counted as fasting.  . Examples of eating schedule: Fast from 7:00pm-11:00am. Eat between 11:00am-7:00pm.  - 24-hour fast: fast for 24 hours up to every other day. Often suggested to start with 1 day per week . Remember the time you sleep is counted as fasting . Examples of eating schedule:  o Eating day: eat 2-3 meals on your eating day. If doing 2 meals, each meal should last no more than 90 minutes. If doing 3 meals, each meal should last no more than 60 minutes. Finish last meal by 7:00pm. o Fasting day: Fast until 7:00pm.  o IF YOU FEEL UNWELL FOR ANY REASON/IN ANY WAY WHEN FASTING, STOP FASTING BY EATING A NUTRITIOUS SNACK OR LIGHT MEAL o ALWAYS FOCUS ON HYDRATION DURING FASTS - Acceptable Hydration sources: water, broths, tea/coffee (black tea/coffee is best but using a small amount of whole-fat dairy products in coffee/tea is acceptable).  - Poor Hydration Sources: anything with sugar or artificial sweeteners added to it  These recommendations have been developed for patients that are actively receiving medical care from either Dr. Juancarlos Crescenzo or Sarah Gray, DNP, NP-C at [facility]. These recommendations are developed for patients with specific medical conditions and are not meant to be distributed or used by others that are not actively receiving care from either provider listed   above at [facility]. It is not appropriate to participate in the above eating plans without proper medical supervision.   Reference: Fung, J. The obesity code. [place]: [place]; 2016.   

## 2019-04-14 ENCOUNTER — Encounter (INDEPENDENT_AMBULATORY_CARE_PROVIDER_SITE_OTHER): Payer: Self-pay | Admitting: Internal Medicine

## 2019-04-14 LAB — HEPATITIS C ANTIBODY
Hepatitis C Ab: NONREACTIVE
SIGNAL TO CUT-OFF: 0.02 (ref <1.00)

## 2019-04-14 LAB — COMPLETE METABOLIC PANEL WITH GFR
AG Ratio: 1.3 (calc) (ref 1.0–2.5)
ALT: 18 U/L (ref 9–46)
AST: 21 U/L (ref 10–35)
Albumin: 4.2 g/dL (ref 3.6–5.1)
Alkaline phosphatase (APISO): 103 U/L (ref 35–144)
BUN/Creatinine Ratio: 12 (calc) (ref 6–22)
BUN: 16 mg/dL (ref 7–25)
CO2: 21 mmol/L (ref 20–32)
Calcium: 9.3 mg/dL (ref 8.6–10.3)
Chloride: 106 mmol/L (ref 98–110)
Creat: 1.34 mg/dL — ABNORMAL HIGH (ref 0.70–1.33)
GFR, Est African American: 69 mL/min/{1.73_m2} (ref >=60)
GFR, Est Non African American: 59 mL/min/{1.73_m2} — ABNORMAL LOW (ref >=60)
Globulin: 3.3 g/dL (calc) (ref 1.9–3.7)
Glucose, Bld: 138 mg/dL — ABNORMAL HIGH (ref 65–99)
Potassium: 4 mmol/L (ref 3.5–5.3)
Sodium: 140 mmol/L (ref 135–146)
Total Bilirubin: 0.6 mg/dL (ref 0.2–1.2)
Total Protein: 7.5 g/dL (ref 6.1–8.1)

## 2019-04-14 LAB — VITAMIN D 25 HYDROXY (VIT D DEFICIENCY, FRACTURES): Vit D, 25-Hydroxy: 57 ng/mL (ref 30–100)

## 2019-04-20 ENCOUNTER — Other Ambulatory Visit (INDEPENDENT_AMBULATORY_CARE_PROVIDER_SITE_OTHER): Payer: Self-pay | Admitting: Internal Medicine

## 2019-04-20 ENCOUNTER — Telehealth (INDEPENDENT_AMBULATORY_CARE_PROVIDER_SITE_OTHER): Payer: Self-pay | Admitting: Internal Medicine

## 2019-04-20 MED ORDER — TESTOSTERONE CYPIONATE 200 MG/ML IM SOLN: 100.0000 mg | INTRAMUSCULAR | 0 refills | Status: DC

## 2019-04-20 NOTE — Telephone Encounter (Signed)
Done

## 2019-07-08 ENCOUNTER — Other Ambulatory Visit (INDEPENDENT_AMBULATORY_CARE_PROVIDER_SITE_OTHER): Payer: Self-pay | Admitting: Internal Medicine

## 2019-07-13 ENCOUNTER — Encounter (INDEPENDENT_AMBULATORY_CARE_PROVIDER_SITE_OTHER): Payer: Self-pay | Admitting: Internal Medicine

## 2019-07-13 ENCOUNTER — Ambulatory Visit (INDEPENDENT_AMBULATORY_CARE_PROVIDER_SITE_OTHER): Payer: 59 | Admitting: Internal Medicine

## 2019-07-13 ENCOUNTER — Other Ambulatory Visit: Payer: Self-pay

## 2019-07-13 VITALS — BP 120/70 | HR 98 | Temp 97.9°F | Resp 18 | Ht 74.0 in | Wt 185.2 lb

## 2019-07-13 DIAGNOSIS — E782 Mixed hyperlipidemia: Secondary | ICD-10-CM

## 2019-07-13 DIAGNOSIS — E291 Testicular hypofunction: Secondary | ICD-10-CM

## 2019-07-13 DIAGNOSIS — I10 Essential (primary) hypertension: Secondary | ICD-10-CM | POA: Diagnosis not present

## 2019-07-13 DIAGNOSIS — E559 Vitamin D deficiency, unspecified: Secondary | ICD-10-CM

## 2019-07-13 NOTE — Progress Notes (Signed)
Metrics: Intervention Frequency ACO  Documented Smoking Status Yearly  Screened one or more times in 24 months  Cessation Counseling or  Active cessation medication Past 24 months  Past 24 months   Guideline developer: UpToDate (See UpToDate for funding source) Date Released: 2014       Wellness Office Visit  Subjective:  Patient ID: Travis Henry, male    DOB: 09/18/1960  Age: 58 y.o. MRN: [id number]  CC: This man comes in for follow-up of vitamin D deficiency, hypertension, hyperlipidemia, hypogonadism. HPI  He is doing well with testosterone therapy once a week and is tolerating this.  He has been started also on Adderall by another physician for focus and concentration but he does not feel that this is helping much and he will let this physician know next time he talks to him. He continues on vitamin D3 supplementation for vitamin D deficiency. He continues on antihypertensive therapy.  He denies any chest pain, dyspnea, palpitations or limb weakness. Past Medical History:  Diagnosis Date  . Chronic pain    back, shoulder, neck  . Depression   . Hyperlipidemia   . Hypertension   . Seizures ([place])   . TBI (traumatic brain injury) ([place])    fell 6 feet from a loading dock  . 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 Vitamin D deficiency disease 04/13/2019      Family History  Problem Relation Age of Onset  . Huntington's disease Mother   . Heart disease Father   . Colon cancer Neg Hx   . Colon polyps Neg Hx     Social History   Social History Narrative   Married for 52 years,second marriage.On disability since 2018,secondary to severe head injury 2016.   Social History   Tobacco Use  . Smoking status: Never Smoker  . Smokeless tobacco: Never Used  Substance Use Topics  . Alcohol use: No    Current Meds  Medication Sig  . amLODipine (NORVASC) 10 MG tablet Take 1 tablet (10 mg total) by mouth daily.  Marland Kitchen amphetamine-dextroamphetamine (ADDERALL) 15 MG tablet Take 15  mg by mouth 2 (two) times daily.   . carvedilol (COREG) 12.5 MG tablet Take 1 tablet (12.5 mg total) by mouth 2 (two) times daily with a meal.  . Cholecalciferol (VITAMIN D-3) 125 MCG (5000 UT) TABS Take 2 tablets by mouth daily.  . DULoxetine (CYMBALTA) 30 MG capsule Take 30 mg by mouth 2 (two) times daily.  . meloxicam (MOBIC) 15 MG tablet Take 15 mg by mouth daily.  . Multiple Vitamins-Minerals (MULTIVITAMIN WITH MINERALS) tablet Take 1 tablet by mouth daily.  Marland Kitchen testosterone cypionate (DEPOTESTOSTERONE CYPIONATE) 200 MG/ML injection INJECT 0.5 MLS (100 MG TOTAL) INTO THE MUSCLE ONCE A WEEK.  . [DISCONTINUED] ibuprofen (ADVIL,MOTRIN) 200 MG tablet Take 400 mg by mouth every 6 (six) hours as needed for mild pain or moderate pain.       Bio Identical Hormones  Testosterone therapy is being used off label for symptoms of testosterone deficiency and benefits that it produces based on several studies.  These benefits include decreasing body fat, increasing in lean muscle mass and increasing in bone density.  There is improvement of memory, cognition.  There is improvement in exercise tolerance and endurance.  Testosterone therapy has also been shown to be protective against coronary artery disease, cerebrovascular disease, diabetes, hypertension and degenerative joint disease. I have discussed with the patient the FDA warnings regarding testosterone therapy, benefits and side effects and modes of administration  as well as monitoring blood levels and side effects  on a regular basis The patient is agreeable that testosterone therapy should be an integral part of his/her wellness,quality of life and prevention of chronic disease.  Objective:   Today's Vitals: BP 120/70 (BP Location: Right Arm, Patient Position: Sitting, Cuff Size: Normal)   Pulse 98   Temp 97.9 F (36.6 C) (Temporal)   Resp 18   Ht _0  (1.88 m)   Wt 185 lb 3.2 oz (84 kg)   SpO2 98% Comment: wearing mask.  BMI 23.78 kg/m    Vitals with BMI 07/13/2019 04/13/2019 03/15/2018  Height _1  _2  -  Weight 185 lbs 3 oz 184 lbs 10 oz -  BMI 14.78 29.56 -  Systolic 213 086 578  Diastolic 70 72 82  Pulse 98 72 80     Physical Exam   He looks systemically well.  His weight is stable.  Blood pressure is well controlled.  He is alert and orientated without any focal neurological signs.    Assessment   1. Essential hypertension   2. Testicular failure   3. Vitamin D deficiency disease   4. Mixed hyperlipidemia       Tests ordered Orders Placed This Encounter  Procedures  . CBC  . CMP with eGFR([facility])  . Testosterone Total,Free,Bio, Males  . Vitamin D, 25-hydroxy  . Lipid Panel     Plan: 1. Blood work is ordered as above. 2. He will continue with antihypertensive therapy and this seems to be controlling his blood pressure well. 3. He will continue with testosterone therapy which seems to be helping him.  We will check levels today. 4. He will continue with vitamin D3 supplementation and we will check levels today. 5. His hyperlipidemia does not require statin therapy but we will check another another lipid panel again today, the last one being checked was about 6 months ago.  We discussed nutrition also today and I told him to delay breakfast till noon to see if this will help him as he will be then doing a form of intermittent fasting.  In the meantime, he tries to eat healthier. 6. Further recommendations will depend on blood results and I will see him in about 3 to 4 months for an annual physical exam.   No orders of the defined types were placed in this encounter.   Doree Albee, MD

## 2019-07-17 LAB — VITAMIN D 25 HYDROXY (VIT D DEFICIENCY, FRACTURES): Vit D, 25-Hydroxy: 68 ng/mL (ref 30–100)

## 2019-07-17 LAB — COMPLETE METABOLIC PANEL WITH GFR
AG Ratio: 1.2 (calc) (ref 1.0–2.5)
ALT: 17 U/L (ref 9–46)
AST: 14 U/L (ref 10–35)
Albumin: 4.1 g/dL (ref 3.6–5.1)
Alkaline phosphatase (APISO): 73 U/L (ref 35–144)
BUN/Creatinine Ratio: 10 (calc) (ref 6–22)
BUN: 14 mg/dL (ref 7–25)
CO2: 26 mmol/L (ref 20–32)
Calcium: 9.6 mg/dL (ref 8.6–10.3)
Chloride: 106 mmol/L (ref 98–110)
Creat: 1.38 mg/dL — ABNORMAL HIGH (ref 0.70–1.33)
GFR, Est African American: 66 mL/min/{1.73_m2} (ref >=60)
GFR, Est Non African American: 57 mL/min/{1.73_m2} — ABNORMAL LOW (ref >=60)
Globulin: 3.5 g/dL (calc) (ref 1.9–3.7)
Glucose, Bld: 120 mg/dL — ABNORMAL HIGH (ref 65–99)
Potassium: 4.2 mmol/L (ref 3.5–5.3)
Sodium: 142 mmol/L (ref 135–146)
Total Bilirubin: 1.4 mg/dL — ABNORMAL HIGH (ref 0.2–1.2)
Total Protein: 7.6 g/dL (ref 6.1–8.1)

## 2019-07-17 LAB — CBC
HCT: 46.7 % (ref 38.5–50.0)
Hemoglobin: 16 g/dL (ref 13.2–17.1)
MCH: 28.5 pg (ref 27.0–33.0)
MCHC: 34.3 g/dL (ref 32.0–36.0)
MCV: 83.1 fL (ref 80.0–100.0)
MPV: 9.2 fL (ref 7.5–12.5)
Platelets: 406 10*3/uL — ABNORMAL HIGH (ref 140–400)
RBC: 5.62 10*6/uL (ref 4.20–5.80)
RDW: 13 % (ref 11.0–15.0)
WBC: 4.9 10*3/uL (ref 3.8–10.8)

## 2019-07-17 LAB — TESTOSTERONE TOTAL,FREE,BIO, MALES
Albumin: 4.1 g/dL (ref 3.6–5.1)
Sex Hormone Binding: 50 nmol/L (ref 10–50)
Testosterone, Bioavailable: 218.5 ng/dL (ref >=110.0)
Testosterone, Free: 116.1 pg/mL (ref 46.0–224.0)
Testosterone: 1046 ng/dL — ABNORMAL HIGH (ref 250–827)

## 2019-07-17 LAB — LIPID PANEL
Cholesterol: 208 mg/dL — ABNORMAL HIGH (ref <200)
HDL: 42 mg/dL (ref >=40)
LDL Cholesterol (Calc): 135 mg/dL (calc) — ABNORMAL HIGH
Non-HDL Cholesterol (Calc): 166 mg/dL (calc) — ABNORMAL HIGH (ref <130)
Total CHOL/HDL Ratio: 5 (calc) — ABNORMAL HIGH (ref <5.0)
Triglycerides: 170 mg/dL — ABNORMAL HIGH (ref <150)

## 2019-07-24 NOTE — Progress Notes (Signed)
Patient called.Notified to  check his myhcart app to review labs; to reply to Dr Karilyn Cota if he as any questions or concerns.

## 2019-10-07 ENCOUNTER — Other Ambulatory Visit (INDEPENDENT_AMBULATORY_CARE_PROVIDER_SITE_OTHER): Payer: Self-pay | Admitting: Internal Medicine

## 2019-10-24 ENCOUNTER — Other Ambulatory Visit: Payer: Self-pay

## 2019-10-24 ENCOUNTER — Ambulatory Visit (INDEPENDENT_AMBULATORY_CARE_PROVIDER_SITE_OTHER): Payer: Medicare Other | Admitting: Internal Medicine

## 2019-10-24 ENCOUNTER — Encounter (INDEPENDENT_AMBULATORY_CARE_PROVIDER_SITE_OTHER): Payer: Self-pay | Admitting: Internal Medicine

## 2019-10-24 VITALS — BP 120/90 | HR 75 | Temp 97.6°F | Ht 74.4 in | Wt 191.0 lb

## 2019-10-24 DIAGNOSIS — Z131 Encounter for screening for diabetes mellitus: Secondary | ICD-10-CM

## 2019-10-24 DIAGNOSIS — E559 Vitamin D deficiency, unspecified: Secondary | ICD-10-CM

## 2019-10-24 DIAGNOSIS — Z0001 Encounter for general adult medical examination with abnormal findings: Secondary | ICD-10-CM | POA: Diagnosis not present

## 2019-10-24 DIAGNOSIS — R350 Frequency of micturition: Secondary | ICD-10-CM

## 2019-10-24 DIAGNOSIS — Z125 Encounter for screening for malignant neoplasm of prostate: Secondary | ICD-10-CM

## 2019-10-24 DIAGNOSIS — E291 Testicular hypofunction: Secondary | ICD-10-CM

## 2019-10-24 DIAGNOSIS — I10 Essential (primary) hypertension: Secondary | ICD-10-CM

## 2019-10-24 DIAGNOSIS — R739 Hyperglycemia, unspecified: Secondary | ICD-10-CM

## 2019-10-24 DIAGNOSIS — E782 Mixed hyperlipidemia: Secondary | ICD-10-CM

## 2019-10-24 MED ORDER — MELOXICAM 15 MG PO TABS: 15.0000 mg | ORAL_TABLET | Freq: Every day | ORAL | 3 refills | Status: DC

## 2019-10-24 MED ORDER — TESTOSTERONE CYPIONATE 200 MG/ML IM SOLN: 100.0000 mg | INTRAMUSCULAR | 3 refills | Status: DC

## 2019-10-24 MED ORDER — AMLODIPINE BESYLATE 10 MG PO TABS: 10.0000 mg | ORAL_TABLET | Freq: Every day | ORAL | 3 refills | Status: DC

## 2019-10-24 MED ORDER — CARVEDILOL 12.5 MG PO TABS: 12.5000 mg | ORAL_TABLET | Freq: Two times a day (BID) | ORAL | 3 refills | Status: DC

## 2019-10-24 MED ORDER — DULOXETINE HCL 30 MG PO CPEP: 30.0000 mg | ORAL_CAPSULE | Freq: Two times a day (BID) | ORAL | 3 refills | Status: DC

## 2019-10-24 NOTE — Progress Notes (Signed)
Chief Complaint: This very pleasant 59 year old man comes in for annual physical exam and to follow-up on his chronic conditions which are described below. HPI: He has a history of hypogonadism and has been on testosterone therapy for some time now and is tolerating this very well without any problems.  He feels that it really has helped him feel better overall with more energy and focus and concentration. He was prescribed Adderall by another physician but he does not feel that the Adderall is helping him at this point with the current dose.  He is due to see him back again. He continues on amlodipine and carvedilol for his hypertension which seems to be under good control.  He denies any chest pain, dyspnea, palpitations or limb weakness. He has a history of hyperlipidemia which does not require statin therapy. He also continues with Cymbalta for previous history of depression but he is not sure that he really needs to have this medication now.  He feels so much better. He continues to exercise on a regular basis throughout the week with a combination of strength training and cardiovascular exercise.  Past Medical History:  Diagnosis Date  . Chronic pain    back, shoulder, neck  . Depression   . Hyperlipidemia   . Hypertension   . Seizures (HCC)   . TBI (traumatic brain injury) (HCC)    fell 6 feet from a loading dock  . 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 Vitamin D deficiency disease 04/13/2019   Past Surgical History:  Procedure Laterality Date  . COLONOSCOPY     unsure date, was in [place], unsure if polyps  . COLONOSCOPY WITH PROPOFOL N/A 03/15/2018   Procedure: COLONOSCOPY WITH PROPOFOL;  Surgeon: West Bali, MD;  Location: [facility];  Service: Endoscopy;  Laterality: N/A;  2:00pm - office LM with new arrival time     Social History   Social History Narrative   Married for 14 years,second marriage.On disability since 2018,secondary to severe head injury 2016.     Social History   Tobacco Use  . Smoking status: Never Smoker  . Smokeless tobacco: Never Used  Substance Use Topics  . Alcohol use: No      Allergies:  Allergies  Allergen Reactions  . Iodinated Diagnostic Agents Swelling  . Shellfish Allergy Swelling     Current Meds  Medication Sig  . amLODipine (NORVASC) 10 MG tablet Take 1 tablet (10 mg total) by mouth daily.  Marland Kitchen amphetamine-dextroamphetamine (ADDERALL) 15 MG tablet Take 15 mg by mouth 2 (two) times daily.   . carvedilol (COREG) 12.5 MG tablet TAKE 1 TABLET (12.5 MG TOTAL) BY MOUTH 2 (TWO) TIMES DAILY WITH A MEAL.  Marland Kitchen Cholecalciferol (VITAMIN D-3) 125 MCG (5000 UT) TABS Take 2 tablets by mouth daily.  . DULoxetine (CYMBALTA) 30 MG capsule Take 30 mg by mouth 2 (two) times daily.  . meloxicam (MOBIC) 15 MG tablet Take 15 mg by mouth daily.  . Multiple Vitamins-Minerals (MULTIVITAMIN WITH MINERALS) tablet Take 1 tablet by mouth daily.  Marland Kitchen testosterone cypionate (DEPOTESTOSTERONE CYPIONATE) 200 MG/ML injection INJECT 0.5 MLS (100 MG TOTAL) INTO THE MUSCLE ONCE A WEEK.       Bio-identical hormones Testosterone therapy is being used off label for symptoms of testosterone deficiency and benefits that it produces based on several studies.  These benefits include decreasing body fat, increasing in lean muscle mass and increasing in bone density.  There is improvement of memory, cognition.  There is improvement in  exercise tolerance and endurance.  Testosterone therapy has also been shown to be protective against coronary artery disease, cerebrovascular disease, diabetes, hypertension and degenerative joint disease. I have discussed with the patient the FDA warnings regarding testosterone therapy, benefits and side effects and modes of administration as well as monitoring blood levels and side effects  on a regular basis The patient is agreeable that testosterone therapy should be an integral part of his/her wellness,quality of  life and prevention of chronic disease.  TDD:UKGUR from the symptoms mentioned above,there are no other symptoms referable to all systems reviewed.  Physical Exam: Blood pressure 120/90, pulse 75, temperature 97.6 F (36.4 C), temperature source Temporal, height 6' 2.4" (1.89 m), weight 191 lb (86.6 kg), SpO2 96 %. Vitals with BMI 10/24/2019 07/13/2019 04/13/2019  Height 6' 2.4" 6\' 2"  6\' 2"   Weight 191 lbs 185 lbs 3 oz 184 lbs 10 oz  BMI 24.25 42.70 62.37  Systolic 628 315 176  Diastolic 90 70 72  Pulse 75 98 72      He looks systemically well.  Blood pressure is under reasonable control.  His weight is stable although he has gained about 6 pounds since the last time he was seen in December. General: Alert, cooperative, and appears to be the stated age.No pallor.  No jaundice.  No clubbing. Head: Normocephalic Eyes: Sclera white, pupils equal and reactive to light, red reflex x 2,  Ears: Normal bilaterally Oral cavity: Lips, mucosa, and tongue normal: Teeth and gums normal Neck: No adenopathy, supple, symmetrical, trachea midline, and thyroid does not appear enlarged Respiratory: Clear to auscultation bilaterally.No wheezing, crackles or bronchial breathing. Cardiovascular: Heart sounds are present and appear to be normal without murmurs or added sounds.  No carotid bruits.  Peripheral pulses are present and equal bilaterally.: Gastrointestinal:positive bowel sounds, no hepatosplenomegaly.  No masses felt.No tenderness. Skin: Clear, No rashes noted.No worrisome skin lesions seen. Neurological: Grossly intact without focal findings, cranial nerves II through XII intact, muscle strength equal bilaterally Musculoskeletal: No acute joint abnormalities noted.Full range of movement noted with joints. Psychiatric: Affect appropriate, non-anxious.    Assessment  1. Testicular failure   2. Essential hypertension   3. Vitamin D deficiency disease   4. Mixed hyperlipidemia   5. Encounter  for general adult medical examination with abnormal findings   6. Special screening for malignant neoplasm of prostate   7. Screening for diabetes mellitus   8. Hyperglycemia, unspecified    9. Frequency of micturition      Tests Ordered:   Orders Placed This Encounter  Procedures  . COMPLETE METABOLIC PANEL WITH GFR  . Hemoglobin A1c  . Lipid panel  . PSA     Plan  1. Blood work is ordered above. 2. He will continue with testosterone therapy as before and his previous levels were in a very good range. 3. His hypertension is under reasonably good control with current medication I will continue with this also. 4. His hyperlipidemia has not required statin therapy and we will check this again today. 5. Further recommendations will depend on blood results and I will see him in about 4 months time for follow-up. 6. Today, in addition to a preventative visit, I performed an office visit to address his chronic conditions above.     No orders of the defined types were placed in this encounter.    Armani Gawlik C Joseeduardo Brix   10/24/2019, 8:59 AM

## 2019-10-25 LAB — COMPLETE METABOLIC PANEL WITH GFR
AG Ratio: 1.2 (calc) (ref 1.0–2.5)
ALT: 19 U/L (ref 9–46)
AST: 20 U/L (ref 10–35)
Albumin: 4.3 g/dL (ref 3.6–5.1)
Alkaline phosphatase (APISO): 82 U/L (ref 35–144)
BUN/Creatinine Ratio: 9 (calc) (ref 6–22)
BUN: 12 mg/dL (ref 7–25)
CO2: 30 mmol/L (ref 20–32)
Calcium: 9.7 mg/dL (ref 8.6–10.3)
Chloride: 103 mmol/L (ref 98–110)
Creat: 1.35 mg/dL — ABNORMAL HIGH (ref 0.70–1.33)
GFR, Est African American: 68 mL/min/{1.73_m2} (ref >=60)
GFR, Est Non African American: 58 mL/min/{1.73_m2} — ABNORMAL LOW (ref >=60)
Globulin: 3.5 g/dL (calc) (ref 1.9–3.7)
Glucose, Bld: 93 mg/dL (ref 65–99)
Potassium: 4.6 mmol/L (ref 3.5–5.3)
Sodium: 140 mmol/L (ref 135–146)
Total Bilirubin: 1.2 mg/dL (ref 0.2–1.2)
Total Protein: 7.8 g/dL (ref 6.1–8.1)

## 2019-10-25 LAB — HEMOGLOBIN A1C
Hgb A1c MFr Bld: 5.2 % of total Hgb (ref <5.7)
Mean Plasma Glucose: 103 (calc)
eAG (mmol/L): 5.7 (calc)

## 2019-10-25 LAB — PSA: PSA: 1.1 ng/mL (ref <=4.0)

## 2019-10-25 LAB — LIPID PANEL
Cholesterol: 227 mg/dL — ABNORMAL HIGH (ref <200)
HDL: 44 mg/dL (ref >=40)
LDL Cholesterol (Calc): 153 mg/dL (calc) — ABNORMAL HIGH
Non-HDL Cholesterol (Calc): 183 mg/dL (calc) — ABNORMAL HIGH (ref <130)
Total CHOL/HDL Ratio: 5.2 (calc) — ABNORMAL HIGH (ref <5.0)
Triglycerides: 165 mg/dL — ABNORMAL HIGH (ref <150)

## 2020-02-25 ENCOUNTER — Other Ambulatory Visit (INDEPENDENT_AMBULATORY_CARE_PROVIDER_SITE_OTHER): Payer: Self-pay | Admitting: Internal Medicine

## 2020-02-26 ENCOUNTER — Encounter (INDEPENDENT_AMBULATORY_CARE_PROVIDER_SITE_OTHER): Payer: Self-pay | Admitting: Internal Medicine

## 2020-02-26 ENCOUNTER — Other Ambulatory Visit: Payer: Self-pay

## 2020-02-26 ENCOUNTER — Ambulatory Visit (INDEPENDENT_AMBULATORY_CARE_PROVIDER_SITE_OTHER): Payer: Medicare Other | Admitting: Internal Medicine

## 2020-02-26 VITALS — BP 135/85 | HR 81 | Temp 97.3°F | Ht 74.4 in | Wt 193.8 lb

## 2020-02-26 DIAGNOSIS — E291 Testicular hypofunction: Secondary | ICD-10-CM | POA: Diagnosis not present

## 2020-02-26 DIAGNOSIS — E782 Mixed hyperlipidemia: Secondary | ICD-10-CM | POA: Diagnosis not present

## 2020-02-26 DIAGNOSIS — I10 Essential (primary) hypertension: Secondary | ICD-10-CM | POA: Diagnosis not present

## 2020-02-26 MED ORDER — CARVEDILOL 12.5 MG PO TABS: 12.5000 mg | ORAL_TABLET | Freq: Two times a day (BID) | ORAL | 3 refills | Status: DC

## 2020-02-26 MED ORDER — AMLODIPINE BESYLATE 10 MG PO TABS: 10.0000 mg | ORAL_TABLET | Freq: Every day | ORAL | 3 refills | Status: DC

## 2020-02-26 NOTE — Progress Notes (Signed)
Metrics: Intervention Frequency ACO  Documented Smoking Status Yearly  Screened one or more times in 24 months  Cessation Counseling or  Active cessation medication Past 24 months  Past 24 months   Guideline developer: UpToDate (See UpToDate for funding source) Date Released: 2014       Wellness Office Visit  Subjective:  Patient ID: Travis Henry, male    DOB: [birth year]  Age: 59 y.o. MRN: [id number]  CC: This man comes in for follow-up of hypertension, hypogonadism, hyperlipidemia. HPI  He is doing very well and is now exercising on a regular basis.  He works out every day. He tries to eat healthy. He continues with amlodipine and carvedilol for his hypertension. He continues on testosterone therapy for his hypogonadism. As far as his hyperlipidemia is concerned, he has not required statin therapy. Past Medical History:  Diagnosis Date  . Chronic pain    back, shoulder, neck  . Depression   . Hyperlipidemia   . Hypertension   . Seizures (HCC)   . TBI (traumatic brain injury) (HCC)    fell 6 feet from a loading dock  . 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 Vitamin D deficiency disease 04/13/2019   Past Surgical History:  Procedure Laterality Date  . COLONOSCOPY     unsure date, was in [place], unsure if polyps  . COLONOSCOPY WITH PROPOFOL N/A 03/15/2018   Procedure: COLONOSCOPY WITH PROPOFOL;  Surgeon: West Bali, MD;  [facility];  Service: Endoscopy;  Laterality: N/A;  2:00pm - office LM with new arrival time     Family History  Problem Relation Age of Onset  . Huntington's disease Mother   . Heart disease Father   . Colon cancer Neg Hx   . Colon polyps Neg Hx     Social History   Social History Narrative   Married for 14 years,second marriage.On disability since 2018,secondary to severe head injury 2016.   Social History   Tobacco Use  . Smoking status: Never Smoker  . Smokeless tobacco: Never Used  Substance Use Topics  . Alcohol  use: No    Current Meds  Medication Sig  . amLODipine (NORVASC) 10 MG tablet Take 1 tablet (10 mg total) by mouth daily.  Marland Kitchen amphetamine-dextroamphetamine (ADDERALL) 15 MG tablet Take 15 mg by mouth 2 (two) times daily.   . carvedilol (COREG) 12.5 MG tablet Take 1 tablet (12.5 mg total) by mouth 2 (two) times daily with a meal.  . Cholecalciferol (VITAMIN D-3) 125 MCG (5000 UT) TABS Take 2 tablets by mouth daily.  . diazepam (VALIUM) 10 MG tablet Take 10 mg by mouth at bedtime.  . meloxicam (MOBIC) 15 MG tablet Take 1 tablet (15 mg total) by mouth daily.  . Multiple Vitamins-Minerals (MULTIVITAMIN WITH MINERALS) tablet Take 1 tablet by mouth daily.  Marland Kitchen testosterone cypionate (DEPOTESTOSTERONE CYPIONATE) 200 MG/ML injection Inject 0.5 mLs (100 mg total) into the muscle once a week.  . [DISCONTINUED] amLODipine (NORVASC) 10 MG tablet Take 1 tablet (10 mg total) by mouth daily.  . [DISCONTINUED] carvedilol (COREG) 12.5 MG tablet Take 1 tablet (12.5 mg total) by mouth 2 (two) times daily with a meal.  . [DISCONTINUED] DULoxetine (CYMBALTA) 30 MG capsule Take 1 capsule (30 mg total) by mouth 2 (two) times daily.       Depression screen [facility] 2/9 10/24/2019 01/21/2015  Decreased Interest 0 0  Down, Depressed, Hopeless 0 0  PHQ - 2 Score 0 0     Objective:  Today's Vitals: BP 135/85 (BP Location: Left Arm, Patient Position: Sitting, Cuff Size: Normal)   Pulse 81   Temp (!) 97.3 F (36.3 C)   Ht 6' 2.4" (1.89 m)   Wt 193 lb 12.8 oz (87.9 kg)   SpO2 97%   BMI 24.62 kg/m  Vitals with BMI 02/26/2020 10/24/2019 07/13/2019  Height 6' 2.4" 6' 2.4" 6\' 2"   Weight 193 lbs 13 oz 191 lbs 185 lbs 3 oz  BMI 24.61 24.25 23.77  Systolic 135 120  Diastolic 85 90 70  Pulse 81 75 98     Physical Exam  He looks systemically well.  Blood pressure reasonably controlled.  Weight is stable.  He is alert and orientated without any focal neurological signs.     Assessment   1. Essential hypertension    2. Mixed hyperlipidemia   3. Testicular failure       Tests ordered Orders Placed This Encounter  Procedures  . COMPLETE METABOLIC PANEL WITH GFR     Plan: 1. I will check his electrolytes to make sure his kidney function is stable and he will continue with amlodipine and carvedilol which I refilled today. 2. He will continue with testosterone therapy for his hypogonadism. 3. He will continue to work on diet as far as his hyperlipidemia is concerned and we emphasized the importance of a plant-based diet again today. 4. Follow-up in 3 months.   Meds ordered this encounter  Medications  . amLODipine (NORVASC) 10 MG tablet    Sig: Take 1 tablet (10 mg total) by mouth daily.    Dispense:  30 tablet    Refill:  3  . carvedilol (COREG) 12.5 MG tablet    Sig: Take 1 tablet (12.5 mg total) by mouth 2 (two) times daily with a meal.    Dispense:  60 tablet    Refill:  3    Jordanna Hendrie 174, MD

## 2020-05-28 ENCOUNTER — Ambulatory Visit (INDEPENDENT_AMBULATORY_CARE_PROVIDER_SITE_OTHER): Payer: Medicare Other | Admitting: Internal Medicine

## 2020-05-28 ENCOUNTER — Encounter (INDEPENDENT_AMBULATORY_CARE_PROVIDER_SITE_OTHER): Payer: Self-pay | Admitting: Internal Medicine

## 2020-05-28 ENCOUNTER — Other Ambulatory Visit: Payer: Self-pay

## 2020-05-28 VITALS — BP 128/70 | HR 68 | Temp 98.4°F | Resp 18 | Ht 74.0 in | Wt 199.8 lb

## 2020-05-28 DIAGNOSIS — E291 Testicular hypofunction: Secondary | ICD-10-CM | POA: Diagnosis not present

## 2020-05-28 DIAGNOSIS — I10 Essential (primary) hypertension: Secondary | ICD-10-CM

## 2020-05-28 DIAGNOSIS — E782 Mixed hyperlipidemia: Secondary | ICD-10-CM

## 2020-05-28 DIAGNOSIS — E559 Vitamin D deficiency, unspecified: Secondary | ICD-10-CM | POA: Diagnosis not present

## 2020-05-28 NOTE — Progress Notes (Signed)
Metrics: Intervention Frequency ACO  Documented Smoking Status Yearly  Screened one or more times in 24 months  Cessation Counseling or  Active cessation medication Past 24 months  Past 24 months   Guideline developer: UpToDate (See UpToDate for funding source) Date Released: 2014       Wellness Office Visit  Subjective:  Patient ID: Travis Henry, male    DOB: 15-Dec-1960  Age: 59 y.o. MRN: [id number]  CC: This man comes in for follow-up of hypertension, posttraumatic hypogonadism, vitamin D deficiency. He also has hyperlipidemia which has not required statin therapy. HPI  He is doing very well. He continues with amlodipine and carvedilol for his hypertension. He only takes Mobic now once every couple of weeks. His overall health is improved dramatically. He continues to exercise on a daily basis lifting weights. He continues on vitamin D3 supplementation for vitamin D deficiency. Past Medical History:  Diagnosis Date  . Chronic pain    back, shoulder, neck  . Depression   . Hyperlipidemia   . Hypertension   . Seizures (HCC)   . TBI (traumatic brain injury) (HCC)    fell 6 feet from a loading dock  . 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 Vitamin D deficiency disease 04/13/2019   Past Surgical History:  Procedure Laterality Date  . COLONOSCOPY     unsure date, was in [place], unsure if polyps  . COLONOSCOPY WITH PROPOFOL N/A 03/15/2018   Procedure: COLONOSCOPY WITH PROPOFOL;  Surgeon: West Bali, MD;  [facility];  Service: Endoscopy;  Laterality: N/A;  2:00pm - office LM with new arrival time     Family History  Problem Relation Age of Onset  . Huntington's disease Mother   . Heart disease Father   . Colon cancer Neg Hx   . Colon polyps Neg Hx     Social History   Social History Narrative   Married for 14 years,second marriage.On disability since 2018,secondary to severe head injury 2016.   Social History   Tobacco Use  . Smoking status: Never  Smoker  . Smokeless tobacco: Never Used  Substance Use Topics  . Alcohol use: No    Current Meds  Medication Sig  . amLODipine (NORVASC) 10 MG tablet Take 1 tablet (10 mg total) by mouth daily.  . carvedilol (COREG) 12.5 MG tablet Take 1 tablet (12.5 mg total) by mouth 2 (two) times daily with a meal.  . Cholecalciferol (VITAMIN D-3) 125 MCG (5000 UT) TABS Take 2 tablets by mouth daily.  . meloxicam (MOBIC) 15 MG tablet Take 1 tablet (15 mg total) by mouth daily. (Patient taking differently: Take 15 mg by mouth daily as needed. )  . Multiple Vitamins-Minerals (MULTIVITAMIN WITH MINERALS) tablet Take 1 tablet by mouth daily.  Marland Kitchen testosterone cypionate (DEPOTESTOSTERONE CYPIONATE) 200 MG/ML injection INJECT 0.5 MLS (100 MG TOTAL) INTO THE MUSCLE ONCE A WEEK.  . [DISCONTINUED] amphetamine-dextroamphetamine (ADDERALL) 15 MG tablet Take 15 mg by mouth 2 (two) times daily.   . [DISCONTINUED] diazepam (VALIUM) 10 MG tablet Take 10 mg by mouth at bedtime.      Depression screen [facility] 2/9 10/24/2019 01/21/2015  Decreased Interest 0 0  Down, Depressed, Hopeless 0 0  PHQ - 2 Score 0 0     Objective:   Today's Vitals: BP 128/70 (BP Location: Left Arm, Patient Position: Sitting, Cuff Size: Normal)   Pulse 68   Temp 98.4 F (36.9 C) (Temporal)   Resp 18   Ht 6\' 2"  (1.88  m)   Wt 199 lb 12.8 oz (90.6 kg)   SpO2 98%   BMI 25.65 kg/m  Vitals with BMI 05/28/2020 02/26/2020 10/24/2019  Height 6\' 2"  6' 2.4" 6' 2.4"  Weight 199 lbs 13 oz 193 lbs 13 oz 191 lbs  BMI 25.64 24.61 24.25  Systolic 128 135  Diastolic 70 85 90  Pulse 68 81 75     Physical Exam He looks systemically well. Weight is stable, he has gained about 6 pounds but I wonder if this is more muscle. Blood pressure is certainly much better. He is alert and orientated without any focal neurological signs.      Assessment   1. Essential hypertension   2. Testicular failure   3. Mixed hyperlipidemia   4. Vitamin D deficiency  disease       Tests ordered Orders Placed This Encounter  Procedures  . Lipid panel  . COMPLETE METABOLIC PANEL WITH GFR     Plan: 1. He will continue with antihypertensive medications listed above as before for the time being. My hope is that we can reduce these medications with time. 2. He will continue with testosterone therapy once a week. 3. He will continue to focus on nutrition and exercise. 4. He will continue with vitamin D3 supplementation for vitamin D deficiency. 5. Further recommendations will depend on blood results and I will see him in 4 months time for follow-up. I did recommend he get COVID-19 booster vaccine now.   No orders of the defined types were placed in this encounter.   093, MD

## 2020-05-29 LAB — COMPLETE METABOLIC PANEL WITH GFR
AG Ratio: 1.3 (calc) (ref 1.0–2.5)
ALT: 19 U/L (ref 9–46)
AST: 19 U/L (ref 10–35)
Albumin: 4.2 g/dL (ref 3.6–5.1)
Alkaline phosphatase (APISO): 79 U/L (ref 35–144)
BUN/Creatinine Ratio: 10 (calc) (ref 6–22)
BUN: 14 mg/dL (ref 7–25)
CO2: 28 mmol/L (ref 20–32)
Calcium: 9.6 mg/dL (ref 8.6–10.3)
Chloride: 102 mmol/L (ref 98–110)
Creat: 1.4 mg/dL — ABNORMAL HIGH (ref 0.70–1.33)
GFR, Est African American: 65 mL/min/{1.73_m2} (ref >=60)
GFR, Est Non African American: 56 mL/min/{1.73_m2} — ABNORMAL LOW (ref >=60)
Globulin: 3.3 g/dL (calc) (ref 1.9–3.7)
Glucose, Bld: 92 mg/dL (ref 65–99)
Potassium: 4.4 mmol/L (ref 3.5–5.3)
Sodium: 136 mmol/L (ref 135–146)
Total Bilirubin: 1 mg/dL (ref 0.2–1.2)
Total Protein: 7.5 g/dL (ref 6.1–8.1)

## 2020-05-29 LAB — LIPID PANEL
Cholesterol: 235 mg/dL — ABNORMAL HIGH (ref <200)
HDL: 47 mg/dL (ref >=40)
LDL Cholesterol (Calc): 154 mg/dL (calc) — ABNORMAL HIGH
Non-HDL Cholesterol (Calc): 188 mg/dL (calc) — ABNORMAL HIGH (ref <130)
Total CHOL/HDL Ratio: 5 (calc) — ABNORMAL HIGH (ref <5.0)
Triglycerides: 207 mg/dL — ABNORMAL HIGH (ref <150)

## 2020-07-02 ENCOUNTER — Other Ambulatory Visit (INDEPENDENT_AMBULATORY_CARE_PROVIDER_SITE_OTHER): Payer: Self-pay | Admitting: Internal Medicine

## 2020-07-03 ENCOUNTER — Other Ambulatory Visit (INDEPENDENT_AMBULATORY_CARE_PROVIDER_SITE_OTHER): Payer: Self-pay | Admitting: Internal Medicine

## 2020-09-23 ENCOUNTER — Telehealth (INDEPENDENT_AMBULATORY_CARE_PROVIDER_SITE_OTHER): Payer: Self-pay

## 2020-09-23 NOTE — Telephone Encounter (Signed)
Patient called and left a voice message that he needs a refill on his Syringes for his testosterone injections.  Last OV 05/28/2020  Next OV 09/25/2020

## 2020-09-23 NOTE — Telephone Encounter (Signed)
Faxed a form for syringe refill request to [facility]. Received a confirmation that the fax went through.

## 2020-09-25 ENCOUNTER — Encounter (INDEPENDENT_AMBULATORY_CARE_PROVIDER_SITE_OTHER): Payer: Self-pay | Admitting: Internal Medicine

## 2020-09-25 ENCOUNTER — Other Ambulatory Visit: Payer: Self-pay

## 2020-09-25 ENCOUNTER — Ambulatory Visit (INDEPENDENT_AMBULATORY_CARE_PROVIDER_SITE_OTHER): Payer: Medicare Other | Admitting: Internal Medicine

## 2020-09-25 VITALS — BP 133/80 | HR 88 | Temp 97.1°F | Resp 18 | Ht 73.0 in | Wt 199.0 lb

## 2020-09-25 DIAGNOSIS — E782 Mixed hyperlipidemia: Secondary | ICD-10-CM

## 2020-09-25 DIAGNOSIS — E291 Testicular hypofunction: Secondary | ICD-10-CM | POA: Diagnosis not present

## 2020-09-25 DIAGNOSIS — I10 Essential (primary) hypertension: Secondary | ICD-10-CM | POA: Diagnosis not present

## 2020-09-25 DIAGNOSIS — E559 Vitamin D deficiency, unspecified: Secondary | ICD-10-CM | POA: Diagnosis not present

## 2020-09-25 MED ORDER — CARVEDILOL 12.5 MG PO TABS
12.5000 mg | ORAL_TABLET | Freq: Two times a day (BID) | ORAL | 1 refills | Status: DC
Start: 2020-09-25 — End: 2021-07-22

## 2020-09-25 MED ORDER — AMLODIPINE BESYLATE 10 MG PO TABS
10.0000 mg | ORAL_TABLET | Freq: Every day | ORAL | 1 refills | Status: DC
Start: 2020-09-25 — End: 2021-07-22

## 2020-09-25 MED ORDER — TESTOSTERONE CYPIONATE 200 MG/ML IM SOLN: 100.0000 mg | INTRAMUSCULAR | 3 refills | Status: AC

## 2020-09-25 NOTE — Progress Notes (Signed)
Metrics: Intervention Frequency ACO  Documented Smoking Status Yearly  Screened one or more times in 24 months  Cessation Counseling or  Active cessation medication Past 24 months  Past 24 months   Guideline developer: UpToDate (See UpToDate for funding source) Date Released: 2014       Wellness Office Visit  Subjective:  Patient ID: Travis Henry, male    DOB: 06-Mar-1961  Age: 60 y.o. MRN: [id number]  CC: This man comes in for follow-up of hypertension, hypogonadism, dyslipidemia and vitamin D deficiency. HPI  He continues on testosterone therapy once a week and is tolerating this well and this is helped him feel better overall. He continues to exercise on a regular basis.  I think his nutrition could be better because he does have dyslipidemia which, thankfully, has not required statin therapy. He continues on vitamin D3 supplementation for vitamin D deficiency. He continues on amlodipine and carvedilol for hypertension.  He denies any chest pain, dyspnea, palpitations or limb weakness. Past Medical History:  Diagnosis Date  . Chronic pain    back, shoulder, neck  . Depression   . Hyperlipidemia   . Hypertension   . Seizures (HCC)   . TBI (traumatic brain injury) (HCC)    fell 6 feet from a loading dock  . 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 Vitamin D deficiency disease 04/13/2019   Past Surgical History:  Procedure Laterality Date  . COLONOSCOPY     unsure date, was in [place], unsure if polyps  . COLONOSCOPY WITH PROPOFOL N/A 03/15/2018   Procedure: COLONOSCOPY WITH PROPOFOL;  Surgeon: West Bali, MD;  [facility];  Service: Endoscopy;  Laterality: N/A;  2:00pm - office LM with new arrival time     Family History  Problem Relation Age of Onset  . Huntington's disease Mother   . Heart disease Father   . Colon cancer Neg Hx   . Colon polyps Neg Hx     Social History   Social History Narrative   Married for 14 years,second marriage.On  disability since 2018,secondary to severe head injury 2016.   Social History   Tobacco Use  . Smoking status: Never Smoker  . Smokeless tobacco: Never Used  Substance Use Topics  . Alcohol use: No    Current Meds  Medication Sig  . Cholecalciferol (VITAMIN D-3) 125 MCG (5000 UT) TABS Take 2 tablets by mouth daily.  . meloxicam (MOBIC) 15 MG tablet Take 1 tablet (15 mg total) by mouth daily. (Patient taking differently: Take 15 mg by mouth daily as needed.)  . Multiple Vitamins-Minerals (MULTIVITAMIN WITH MINERALS) tablet Take 1 tablet by mouth daily.  . [DISCONTINUED] amLODipine (NORVASC) 10 MG tablet TAKE 1 TABLET BY MOUTH EVERY DAY  . [DISCONTINUED] carvedilol (COREG) 12.5 MG tablet Take 1 tablet (12.5 mg total) by mouth 2 (two) times daily with a meal.  . [DISCONTINUED] testosterone cypionate (DEPOTESTOSTERONE CYPIONATE) 200 MG/ML injection INJECT 0.5 MLS (100 MG TOTAL) INTO THE MUSCLE ONCE A WEEK.       Objective:   Today's Vitals: BP 133/80 (BP Location: Left Arm, Patient Position: Sitting, Cuff Size: Normal)   Pulse 88   Temp (!) 97.1 F (36.2 C) (Temporal)   Resp 18   Ht 6\' 1"  (1.854 m)   Wt 199 lb (90.3 kg)   SpO2 98%   BMI 26.25 kg/m  Vitals with BMI 09/25/2020 05/28/2020 02/26/2020  Height 6\' 1"  6\' 2"  6' 2.4"  Weight 199 lbs 199 lbs 13 oz  193 lbs 13 oz  BMI 26.26 25.64 24.61  Systolic 133 128 093  Diastolic 80 70 85  Pulse 88 68 81     Physical Exam  He looks systemically well.  Weight is unchanged and appears to be stable, he is overweight.  Blood pressure is acceptable.  He is alert and orientated without any focal logical signs.     Assessment   1. Essential hypertension   2. Testicular failure   3. Mixed hyperlipidemia   4. Vitamin D deficiency disease       Tests ordered No orders of the defined types were placed in this encounter.    Plan: 1. He will continue with carvedilol and amlodipine for his hypertension which is controlling his  blood pressure reasonably well. 2. He will continue with testosterone therapy as before. 3. He will continue to work on nutrition and I have told him to reduce animal protein intake on a weekly basis, may be eat animal protein twice a week at the most rather than every day which he is doing right now. 4. He will continue with vitamin D3 supplementation for vitamin D deficiency. 5. I will see him in 3 months for a prolonged office visit.  We will do all the blood work then.   Meds ordered this encounter  Medications  . amLODipine (NORVASC) 10 MG tablet    Sig: Take 1 tablet (10 mg total) by mouth daily.    Dispense:  90 tablet    Refill:  1  . carvedilol (COREG) 12.5 MG tablet    Sig: Take 1 tablet (12.5 mg total) by mouth 2 (two) times daily with a meal.    Dispense:  180 tablet    Refill:  1  . testosterone cypionate (DEPOTESTOSTERONE CYPIONATE) 200 MG/ML injection    Sig: Inject 0.5 mLs (100 mg total) into the muscle once a week.    Dispense:  4 mL    Refill:  3    This request is for a new prescription for a controlled substance as required by Federal/State law.    Wilson Singer, MD

## 2021-01-01 ENCOUNTER — Other Ambulatory Visit: Payer: Self-pay

## 2021-01-01 ENCOUNTER — Ambulatory Visit (INDEPENDENT_AMBULATORY_CARE_PROVIDER_SITE_OTHER): Payer: Medicare Other | Admitting: Internal Medicine

## 2021-01-01 ENCOUNTER — Encounter (INDEPENDENT_AMBULATORY_CARE_PROVIDER_SITE_OTHER): Payer: Self-pay | Admitting: Internal Medicine

## 2021-01-01 VITALS — BP 124/92 | HR 81 | Temp 97.1°F | Ht 73.0 in | Wt 194.8 lb

## 2021-01-01 DIAGNOSIS — E559 Vitamin D deficiency, unspecified: Secondary | ICD-10-CM

## 2021-01-01 DIAGNOSIS — Z125 Encounter for screening for malignant neoplasm of prostate: Secondary | ICD-10-CM | POA: Diagnosis not present

## 2021-01-01 DIAGNOSIS — E291 Testicular hypofunction: Secondary | ICD-10-CM

## 2021-01-01 DIAGNOSIS — I10 Essential (primary) hypertension: Secondary | ICD-10-CM

## 2021-01-01 DIAGNOSIS — E782 Mixed hyperlipidemia: Secondary | ICD-10-CM | POA: Diagnosis not present

## 2021-01-01 NOTE — Progress Notes (Signed)
Metrics: Intervention Frequency ACO  Documented Smoking Status Yearly  Screened one or more times in 24 months  Cessation Counseling or  Active cessation medication Past 24 months  Past 24 months   Guideline developer: UpToDate (See UpToDate for funding source) Date Released: 2014       Wellness Office Visit  Subjective:  Patient ID: Travis Henry, male    DOB: 02/04/1961  Age: 60 y.o. MRN: [id number]  CC: This very pleasant man comes in for follow-up of hypertension, hyperlipidemia, vitamin D deficiency, hypogonadism. HPI  He is doing very well.  He continues with testosterone therapy once a week.  He is exercising on a regular basis and strength training significantly now apparently. He continues on amlodipine and carvedilol for hypertension.  He has no chest pain, dyspnea, palpitations or limb weakness. He continues on vitamin D3 10,000 units daily for vitamin D deficiency. For his chronic back pain, he takes meloxicam but only once or twice in a whole month. 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   Vitamin D deficiency disease 04/13/2019   Past Surgical History:  Procedure Laterality Date   COLONOSCOPY     unsure date, was in [place], unsure if polyps   COLONOSCOPY WITH PROPOFOL N/A 03/15/2018   Procedure: COLONOSCOPY WITH PROPOFOL;  Surgeon: West Bali, MD;  [facility];  Service: Endoscopy;  Laterality: N/A;  2:00pm - office LM with new arrival time     Family History  Problem Relation Age of Onset   Huntington's disease Mother    Heart disease Father    Colon cancer Neg Hx    Colon polyps Neg Hx     Social History   Social History Narrative   Married for 14 years,second marriage.On disability since 2018,secondary to severe head  injury 2016.   Social History   Tobacco Use   Smoking status: Never   Smokeless tobacco: Never  Substance Use Topics   Alcohol use: No    Current Meds  Medication Sig   amLODipine (NORVASC) 10 MG tablet Take 1 tablet (10 mg total) by mouth daily.   carvedilol (COREG) 12.5 MG tablet Take 1 tablet (12.5 mg total) by mouth 2 (two) times daily with a meal.   Cholecalciferol (VITAMIN D-3) 125 MCG (5000 UT) TABS Take 2 tablets by mouth daily.   meloxicam (MOBIC) 15 MG tablet Take 1 tablet (15 mg total) by mouth daily. (Patient taking differently: Take 15 mg by mouth daily as needed.)   Multiple Vitamins-Minerals (MULTIVITAMIN WITH MINERALS) tablet Take 1 tablet by mouth daily.   testosterone cypionate (DEPOTESTOSTERONE CYPIONATE) 200 MG/ML injection Inject 0.5 mLs (100 mg total) into the muscle once a week.     Flowsheet Row Office Visit from 01/01/2021 in [facility]  PHQ-9 Total Score 0       Objective:   Today's Vitals: BP (!) 124/92   Pulse 81   Temp (!) 97.1 F (36.2 C) (Temporal)   Ht 6\' 1"  (1.854 m)   Wt 194 lb 12.8 oz (88.4 kg)   SpO2 97%   BMI 25.70 kg/m  Vitals with BMI 01/01/2021 09/25/2020 05/28/2020  Height 6\' 1"  6\' 1"  6\' 2"   Weight 194 lbs  13 oz 199 lbs 199 lbs 13 oz  BMI 25.71 26.26 25.64  Systolic 124 133 242  Diastolic 92 80 70  Pulse 81 88 68     Physical Exam He looks systemically well.  Diastolic blood pressure somewhat elevated but he was up late last night he tells me.  He has lost 5 pounds in weight.  Heart sounds are present without murmurs.  There are no carotid bruits.  Lung fields are clear.  He is alert and orientated without any focal neurologic signs.      Assessment   1. Essential hypertension   2. Mixed hyperlipidemia   3. Vitamin D deficiency disease   4. Testicular failure   5. Special screening for malignant neoplasm of prostate       Tests ordered Orders Placed This Encounter  Procedures   CBC   COMPLETE  METABOLIC PANEL WITH GFR   Lipid panel   T3, free   T4, free   TSH   VITAMIN D 25 Hydroxy (Vit-D Deficiency, Fractures)   Testosterone Total,Free,Bio, Males   PSA, Total with Reflex to PSA, Free      Plan: 1.  Continue with carvedilol and amlodipine for his hypertension which is essentially well controlled. 2.  Continue with testosterone therapy as before and we will check levels. 3.  Continue with vitamin D3 10,000 units daily and we will check levels today. 4.  Other blood work is ordered. 5.  I will see him in about 3 to 4 months time for follow-up.  Further recommendations will depend on blood results.  I recommended to him that he get COVID-19 booster which she has not yet gotten.    No orders of the defined types were placed in this encounter.   Wilson Singer, MD

## 2021-01-02 LAB — TESTOSTERONE TOTAL,FREE,BIO, MALES
Albumin: 4.4 g/dL (ref 3.6–5.1)
Sex Hormone Binding: 55 nmol/L (ref 22–77)
Testosterone, Bioavailable: 139.9 ng/dL (ref 110.0–575.0)
Testosterone, Free: 69.5 pg/mL (ref 46.0–224.0)
Testosterone: 756 ng/dL (ref 250–827)

## 2021-01-02 LAB — COMPLETE METABOLIC PANEL WITH GFR
AG Ratio: 1.3 (calc) (ref 1.0–2.5)
ALT: 15 U/L (ref 9–46)
AST: 15 U/L (ref 10–35)
Albumin: 4.4 g/dL (ref 3.6–5.1)
Alkaline phosphatase (APISO): 95 U/L (ref 35–144)
BUN/Creatinine Ratio: 10 (calc) (ref 6–22)
BUN: 14 mg/dL (ref 7–25)
CO2: 25 mmol/L (ref 20–32)
Calcium: 9.3 mg/dL (ref 8.6–10.3)
Chloride: 103 mmol/L (ref 98–110)
Creat: 1.42 mg/dL — ABNORMAL HIGH (ref 0.70–1.33)
GFR, Est African American: 63 mL/min/{1.73_m2} (ref >=60)
GFR, Est Non African American: 54 mL/min/{1.73_m2} — ABNORMAL LOW (ref >=60)
Globulin: 3.3 g/dL (calc) (ref 1.9–3.7)
Glucose, Bld: 123 mg/dL — ABNORMAL HIGH (ref 65–99)
Potassium: 4 mmol/L (ref 3.5–5.3)
Sodium: 137 mmol/L (ref 135–146)
Total Bilirubin: 1.1 mg/dL (ref 0.2–1.2)
Total Protein: 7.7 g/dL (ref 6.1–8.1)

## 2021-01-02 LAB — TSH: TSH: 2.36 mIU/L (ref 0.40–4.50)

## 2021-01-02 LAB — T3, FREE: T3, Free: 3.4 pg/mL (ref 2.3–4.2)

## 2021-01-02 LAB — CBC
HCT: 47.5 % (ref 38.5–50.0)
Hemoglobin: 16.1 g/dL (ref 13.2–17.1)
MCH: 28.7 pg (ref 27.0–33.0)
MCHC: 33.9 g/dL (ref 32.0–36.0)
MCV: 84.7 fL (ref 80.0–100.0)
MPV: 8.5 fL (ref 7.5–12.5)
Platelets: 374 10*3/uL (ref 140–400)
RBC: 5.61 10*6/uL (ref 4.20–5.80)
RDW: 14.2 % (ref 11.0–15.0)
WBC: 4.6 10*3/uL (ref 3.8–10.8)

## 2021-01-02 LAB — ABN TEST REFUSAL

## 2021-01-02 LAB — T4, FREE: Free T4: 1 ng/dL (ref 0.8–1.8)

## 2021-01-02 LAB — VITAMIN D 25 HYDROXY (VIT D DEFICIENCY, FRACTURES): Vit D, 25-Hydroxy: 76 ng/mL (ref 30–100)

## 2021-04-17 ENCOUNTER — Ambulatory Visit (INDEPENDENT_AMBULATORY_CARE_PROVIDER_SITE_OTHER): Payer: 59 | Admitting: Internal Medicine

## 2021-07-22 ENCOUNTER — Encounter: Payer: Self-pay | Admitting: Emergency Medicine

## 2021-07-22 ENCOUNTER — Ambulatory Visit
Admission: EM | Admit: 2021-07-22 | Discharge: 2021-07-22 | Disposition: A | Discharge status: Home / Self Care | Payer: Medicare Other | Attending: Urgent Care | Admitting: Urgent Care

## 2021-07-22 ENCOUNTER — Other Ambulatory Visit: Payer: Self-pay

## 2021-07-22 DIAGNOSIS — I10 Essential (primary) hypertension: Secondary | ICD-10-CM | POA: Diagnosis not present

## 2021-07-22 MED ORDER — AMLODIPINE BESYLATE 10 MG PO TABS: 10.0000 mg | ORAL_TABLET | Freq: Every day | ORAL | 0 refills | Status: DC

## 2021-07-22 MED ORDER — CARVEDILOL 12.5 MG PO TABS: 12.5000 mg | ORAL_TABLET | Freq: Two times a day (BID) | ORAL | 0 refills | Status: DC

## 2021-07-22 NOTE — ED Triage Notes (Signed)
Needs medication refill for amlodipine 10mg  and carvedilol 12.5 mg.  Would also like to get testosterone medication refilled.

## 2021-07-22 NOTE — Discharge Instructions (Signed)

## 2021-07-22 NOTE — ED Provider Notes (Signed)
[facility]   MRN: [id number] DOB: [birth year]  Subjective:   Travis Henry is a 61 y.o. male presenting for medication refill of his blood pressure medicines.  Takes amlodipine and carvedilol.  He is also got testosterone in the past but does not know exactly why.  He lost his PCP has he passed away.  Would like recommendations for new PCPs.  No headache, dizziness, confusion, chest pain, shortness of breath, nausea, vomiting, abdominal pain.  Patient is not a smoker.  No current facility-administered medications for this encounter.  Current Outpatient Medications:    amLODipine (NORVASC) 10 MG tablet, Take 1 tablet (10 mg total) by mouth daily., Disp: 90 tablet, Rfl: 1   carvedilol (COREG) 12.5 MG tablet, Take 1 tablet (12.5 mg total) by mouth 2 (two) times daily with a meal., Disp: 180 tablet, Rfl: 1   Cholecalciferol (VITAMIN D-3) 125 MCG (5000 UT) TABS, Take 2 tablets by mouth daily., Disp: , Rfl:    meloxicam (MOBIC) 15 MG tablet, Take 1 tablet (15 mg total) by mouth daily. (Patient taking differently: Take 15 mg by mouth daily as needed.), Disp: 30 tablet, Rfl: 3   Multiple Vitamins-Minerals (MULTIVITAMIN WITH MINERALS) tablet, Take 1 tablet by mouth daily., Disp: , Rfl:    testosterone cypionate (DEPOTESTOSTERONE CYPIONATE) 200 MG/ML injection, Inject 0.5 mLs (100 mg total) into the muscle once a week., Disp: 4 mL, Rfl: 3   Allergies  Allergen Reactions   Iodinated Contrast Media Swelling   Shellfish Allergy Swelling    Past Medical History:  Diagnosis Date   Chronic pain    back, shoulder, neck   Depression    Hyperlipidemia    Hypertension    Seizures (HCC)    TBI (traumatic brain injury)    fell 6 feet from a loading dock   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   Vitamin D deficiency disease 04/13/2019     Past Surgical History:  Procedure Laterality Date   COLONOSCOPY     unsure date, was in [place], unsure if polyps   COLONOSCOPY WITH PROPOFOL  N/A 03/15/2018   Procedure: COLONOSCOPY WITH PROPOFOL;  Surgeon: West Bali, MD;  [facility];  Service: Endoscopy;  Laterality: N/A;  2:00pm - office LM with new arrival time    Family History  Problem Relation Age of Onset   Huntington's disease Mother    Heart disease Father    Colon cancer Neg Hx    Colon polyps Neg Hx     Social History   Tobacco Use   Smoking status: Never   Smokeless tobacco: Never  Vaping Use   Vaping Use: Never used  Substance Use Topics   Alcohol use: No   Drug use: No    ROS   Objective:   Vitals: BP (!) 151/87 (BP Location: Right Arm)    Pulse 82    Temp 98.2 F (36.8 C) (Oral)    Resp 18    SpO2 97%   Physical Exam Constitutional:      General: He is not in acute distress.    Appearance: Normal appearance. He is well-developed. He is not ill-appearing, toxic-appearing or diaphoretic.  HENT:     Head: Normocephalic and atraumatic.     Right Ear: External ear normal.     Left Ear: External ear normal.     Nose: Nose normal.     Mouth/Throat:     Mouth: Mucous membranes are moist.     Pharynx: Oropharynx is clear.  Eyes:     General: No scleral icterus.    Extraocular Movements: Extraocular movements intact.     Pupils: Pupils are equal, round, and reactive to light.  Cardiovascular:     Rate and Rhythm: Normal rate and regular rhythm.     Heart sounds: Normal heart sounds. No murmur heard.   No friction rub. No gallop.  Pulmonary:     Effort: Pulmonary effort is normal. No respiratory distress.     Breath sounds: Normal breath sounds. No stridor. No wheezing, rhonchi or rales.  Neurological:     Mental Status: He is alert and oriented to person, place, and time.     Cranial Nerves: No cranial nerve deficit.     Motor: No weakness.     Coordination: Coordination normal.     Gait: Gait normal.     Comments: Negative Romberg and pronator drift.  Psychiatric:        Mood and Affect: Mood normal.        Behavior:  Behavior normal.        Thought Content: Thought content normal.      Assessment and Plan :   PDMP not reviewed this encounter.  1. Essential hypertension    Refill medications for essential hypertension and declined testosterone injection refill.  Provided general guidance for his chronic health conditions.  Recommended 2 different PCPs for him to try and establish with. Counseled patient on potential for adverse effects with medications prescribed/recommended today, ER and return-to-clinic precautions discussed, patient verbalized understanding.    Wallis Bamberg, PA-C 07/22/21 1253

## 2021-09-15 ENCOUNTER — Ambulatory Visit: Payer: 59 | Admitting: Nurse Practitioner

## 2021-10-02 ENCOUNTER — Other Ambulatory Visit: Payer: Self-pay

## 2021-10-02 ENCOUNTER — Encounter: Payer: Self-pay | Admitting: Nurse Practitioner

## 2021-10-02 ENCOUNTER — Ambulatory Visit (INDEPENDENT_AMBULATORY_CARE_PROVIDER_SITE_OTHER): Payer: Medicare Other | Admitting: Nurse Practitioner

## 2021-10-02 VITALS — BP 131/83 | HR 71 | Ht 74.0 in | Wt 174.0 lb

## 2021-10-02 DIAGNOSIS — G4489 Other headache syndrome: Secondary | ICD-10-CM | POA: Diagnosis not present

## 2021-10-02 DIAGNOSIS — G44309 Post-traumatic headache, unspecified, not intractable: Secondary | ICD-10-CM | POA: Insufficient documentation

## 2021-10-02 DIAGNOSIS — I10 Essential (primary) hypertension: Secondary | ICD-10-CM | POA: Diagnosis not present

## 2021-10-02 DIAGNOSIS — N529 Male erectile dysfunction, unspecified: Secondary | ICD-10-CM | POA: Diagnosis not present

## 2021-10-02 DIAGNOSIS — Z23 Encounter for immunization: Secondary | ICD-10-CM | POA: Diagnosis not present

## 2021-10-02 MED ORDER — TOPIRAMATE 25 MG PO TABS: 25.0000 mg | ORAL_TABLET | Freq: Every day | ORAL | 1 refills | Status: DC

## 2021-10-02 MED ORDER — CARVEDILOL 12.5 MG PO TABS: 12.5000 mg | ORAL_TABLET | Freq: Two times a day (BID) | ORAL | 0 refills | Status: DC

## 2021-10-02 MED ORDER — SILDENAFIL CITRATE 50 MG PO TABS: 50.0000 mg | ORAL_TABLET | Freq: Every day | ORAL | 0 refills | Status: DC | PRN

## 2021-10-02 MED ORDER — AMLODIPINE BESYLATE 10 MG PO TABS: 10.0000 mg | ORAL_TABLET | Freq: Every day | ORAL | 0 refills | Status: DC

## 2021-10-02 NOTE — Assessment & Plan Note (Signed)
BP Readings from Last 3 Encounters:  ?10/02/21 131/83  ?07/22/21 (!) 151/87  ?01/01/21 (!) 124/92  ?Takes amlodipine 10 mg daily, carvedilol 12.5 mg twice daily condition well-controlled ?Check CMP plus EGFR today ?Avoid salt engage in regular exercise. ?

## 2021-10-02 NOTE — Progress Notes (Signed)
? ?New Patient Office Visit ? ?Subjective:  ?Patient ID: Travis Henry, male    DOB: [birth year]  Age: 61 y.o. MRN: [id number] ? ?CC:  ?Chief Complaint  ?Patient presents with  ? New Patient (Initial Visit)  ?  NP  ? ? ?HPI ?Travis Henry with medical history of hypertension, seizure, TBI hyperlipidemia, testicular failure, vitamin D deficiency, postconcussion headaches presents to establish care, previous PCP Dr Anastasio Champion. ? ? Patient stated that he suffered TBI in 2016 he had fell 6 feet from a loading dock hitting his head, patient states that he has been having headaches since then he last saw a neurologist in 2022 he stated that he stopped seeing neurologist because his Worker's Compensation ran out patient denies recent changes in his vision, seizures nausea vomiting states that he would like referral to a new neurologist.  Patient states that he has been taking Tylenol and aspirin for his pain.  Has constant throbbing pain 10/10. Sitting in the dark room helps his HA.  ? ?Patient complained of having erectile dysfunction since after he had TBI 2016. He was previously taking teststerone injection . He has never tried any other meications for his condition.  Patient denies urinary incontinence dysuria. ? ? ? ?  ? ?Past Medical History:  ?Diagnosis Date  ? Chronic pain   ? back, shoulder, neck  ? Depression   ? Hyperlipidemia   ? Hypertension   ? Post-concussion headache   ? Seizures ([place])   ? TBI (traumatic brain injury)   ? fell 6 feet from a loading dock  ? 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  ? Vitamin D deficiency disease 04/13/2019  ? ? ?Past Surgical History:  ?Procedure Laterality Date  ? COLONOSCOPY    ? unsure date, was in [place], unsure if polyps  ? COLONOSCOPY WITH PROPOFOL N/A 03/15/2018  ? Procedure: COLONOSCOPY WITH PROPOFOL;  Surgeon: Danie Binder, MD;  Location: [facility];  Service: Endoscopy;  Laterality: N/A;  2:00pm - office LM with new arrival time  ? ? ?Family History  ?Problem  Relation Age of Onset  ? Huntington's disease Mother   ? Heart disease Father   ? Colon cancer Neg Hx   ? Colon polyps Neg Hx   ? ? ?Social History  ? ?Socioeconomic History  ? Marital status: Married  ?  Spouse name: Not on file  ? Number of children: Not on file  ? Years of education: Not on file  ? Highest education level: Not on file  ?Occupational History  ? Not on file  ?Tobacco Use  ? Smoking status: Never  ? Smokeless tobacco: Never  ?Vaping Use  ? Vaping Use: Never used  ?Substance and Sexual Activity  ? Alcohol use: No  ? Drug use: No  ? Sexual activity: Yes  ?Other Topics Concern  ? Not on file  ?Social History Narrative  ? Married for 82 years,second marriage.On disability since 2018,secondary to severe head injury 2016.  ? ?Social Determinants of Health  ? ?Financial Resource Strain: Not on file  ?Food Insecurity: Not on file  ?Transportation Needs: Not on file  ?Physical Activity: Not on file  ?Stress: Not on file  ?Social Connections: Not on file  ?Intimate Partner Violence: Not on file  ? ? ?ROS ?Review of Systems  ?Constitutional: Negative.   ?Eyes:  Positive for photophobia.  ?Respiratory: Negative.    ?Cardiovascular: Negative.   ?Neurological:  Positive for headaches. Negative for dizziness, tremors, seizures, syncope, facial asymmetry, speech  difficulty, weakness, light-headedness and numbness.  ?Psychiatric/Behavioral: Negative.    ? ?Objective:  ? ?Today's Vitals: BP 131/83 (BP Location: Right Arm, Patient Position: Sitting, Cuff Size: Large)   Pulse 71   Ht 6' 2"  (1.88 m)   Wt 174 lb (78.9 kg)   SpO2 99%   BMI 22.34 kg/m?  ? ?Physical Exam ?Constitutional:   ?   General: He is not in acute distress. ?   Appearance: Normal appearance. He is not ill-appearing, toxic-appearing or diaphoretic.  ?HENT:  ?   Head: Normocephalic and atraumatic.  ?   Nose: Nose normal.  ?   Mouth/Throat:  ?   Mouth: Mucous membranes are moist.  ?   Pharynx: Oropharynx is clear. No oropharyngeal exudate or  posterior oropharyngeal erythema.  ?Eyes:  ?   General: No scleral icterus.    ?   Right eye: No discharge.     ?   Left eye: No discharge.  ?   Extraocular Movements: Extraocular movements intact.  ?   Pupils: Pupils are equal, round, and reactive to light.  ?Cardiovascular:  ?   Rate and Rhythm: Normal rate and regular rhythm.  ?   Pulses: Normal pulses.  ?   Heart sounds: Normal heart sounds. No murmur heard. ?  No friction rub. No gallop.  ?Pulmonary:  ?   Effort: Pulmonary effort is normal. No respiratory distress.  ?   Breath sounds: Normal breath sounds. No stridor. No wheezing, rhonchi or rales.  ?Chest:  ?   Chest wall: No tenderness.  ?Abdominal:  ?   Palpations: Abdomen is soft.  ?Neurological:  ?   General: No focal deficit present.  ?   Mental Status: He is alert and oriented to person, place, and time.  ?   Cranial Nerves: No cranial nerve deficit.  ?   Sensory: No sensory deficit.  ?   Motor: No weakness.  ?   Coordination: Coordination normal.  ?   Gait: Gait normal.  ?   Deep Tendon Reflexes: Reflexes normal.  ?Psychiatric:     ?   Mood and Affect: Mood normal.     ?   Behavior: Behavior normal.     ?   Thought Content: Thought content normal.     ?   Judgment: Judgment normal.  ? ? ?Assessment & Plan:  ? ?Problem List Items Addressed This Visit   ? ?  ? Cardiovascular and Mediastinum  ? HTN (hypertension)  ?  BP Readings from Last 3 Encounters:  ?10/02/21 131/83  ?07/22/21 (!) 151/87  ?01/01/21 (!) 124/92  ?Takes amlodipine 10 mg daily, carvedilol 12.5 mg twice daily condition well-controlled ?Check CMP plus EGFR today ?Avoid salt engage in regular exercise. ?  ?  ? Relevant Medications  ? sildenafil (VIAGRA) 50 MG tablet  ? amLODipine (NORVASC) 10 MG tablet  ? carvedilol (COREG) 12.5 MG tablet  ? Other Relevant Orders  ? CMP14+EGFR  ?  ? Other  ? Post-concussion headache  ?  Chronic condition started after sustaining TBI previously followed by neurology patient states that he cannot remember being  on any medication for this. ?Urgent referral to neurology today, patient told to take Tylenol 650 mg every 6 hours as needed for headaches.  Topamax not started today due to reduced kidney function. No imaging studies  found on EPIC ?  ?  ? Relevant Medications  ? amLODipine (NORVASC) 10 MG tablet  ? carvedilol (COREG) 12.5 MG tablet  ? Need for  immunization against influenza  ? Relevant Orders  ? Flu Vaccine QUAD 59moIM (Fluarix, Fluzone & Alfiuria Quad PF) (Completed)  ? Erectile dysfunction - Primary  ?  Was on testosterone 100 mg weekly injection ?Start sildenafil 50 mg once daily as needed. ?Side effects of medication discussed with patient. ?Will refer to urology if medication not effective ?  ?  ? Relevant Medications  ? sildenafil (VIAGRA) 50 MG tablet  ? ? ?Outpatient Encounter Medications as of 10/02/2021  ?Medication Sig  ? Cholecalciferol (VITAMIN D-3) 125 MCG (5000 UT) TABS Take 2 tablets by mouth daily.  ? Multiple Vitamins-Minerals (MULTIVITAMIN WITH MINERALS) tablet Take 1 tablet by mouth daily.  ? sildenafil (VIAGRA) 50 MG tablet Take 1 tablet (50 mg total) by mouth daily as needed for erectile dysfunction.  ? [DISCONTINUED] amLODipine (NORVASC) 10 MG tablet Take 1 tablet (10 mg total) by mouth daily.  ? [DISCONTINUED] carvedilol (COREG) 12.5 MG tablet Take 1 tablet (12.5 mg total) by mouth 2 (two) times daily with a meal.  ? [DISCONTINUED] meloxicam (MOBIC) 15 MG tablet Take 1 tablet (15 mg total) by mouth daily. (Patient taking differently: Take 15 mg by mouth daily as needed.)  ? [DISCONTINUED] topiramate (TOPAMAX) 25 MG tablet Take 1 tablet (25 mg total) by mouth daily.  ? amLODipine (NORVASC) 10 MG tablet Take 1 tablet (10 mg total) by mouth daily.  ? carvedilol (COREG) 12.5 MG tablet Take 1 tablet (12.5 mg total) by mouth 2 (two) times daily with a meal.  ? testosterone cypionate (DEPOTESTOSTERONE CYPIONATE) 200 MG/ML injection Inject 0.5 mLs (100 mg total) into the muscle once a week. (Patient  not taking: Reported on 10/02/2021)  ? ?No facility-administered encounter medications on file as of 10/02/2021.  ? ? ?Follow-up: Return in about 4 months (around 02/01/2022) for annual physical .  ? ?Sharnee Douglass R Pa

## 2021-10-02 NOTE — Assessment & Plan Note (Signed)
Was on testosterone 100 mg weekly injection ?Start sildenafil 50 mg once daily as needed. ?Side effects of medication discussed with patient. ?Will refer to urology if medication not effective ?

## 2021-10-02 NOTE — Assessment & Plan Note (Addendum)
Chronic condition started after sustaining TBI previously followed by neurology patient states that he cannot remember being on any medication for this. ?Urgent referral to neurology today, patient told to take Tylenol 650 mg every 6 hours as needed for headaches.  Topamax not started today due to reduced kidney function. No imaging studies  found on EPIC ?

## 2021-10-02 NOTE — Patient Instructions (Addendum)
Please get your flu vaccine today, get your shingles and covid booster at your pharmacy  ? ? ?Pleas take Sildenafil 50 mg once daily as needed 1 hour before sexual activity; may be taken up to 4 hours before sexual activity. Reduce to 25 mg once daily if side effects occur  ? ?It is important that you exercise regularly at least 30 minutes 5 times a week.  ?Think about what you will eat, plan ahead. ?Choose " clean, green, fresh or frozen" over canned, processed or packaged foods which are more sugary, salty and fatty. ?70 to 75% of food eaten should be vegetables and fruit. ?Three meals at set times with snacks allowed between meals, but they must be fruit or vegetables. ?Aim to eat over a 12 hour period , example 7 am to 7 pm, and STOP after  your last meal of the day. ?Drink water,generally about 64 ounces per day, no other drink is as healthy. Fruit juice is best enjoyed in a healthy way, by EATING the fruit. ? ?Thanks for choosing [facility], we consider it a privelige to serve you. ? ?

## 2021-10-03 ENCOUNTER — Other Ambulatory Visit: Payer: Self-pay

## 2021-10-03 DIAGNOSIS — N529 Male erectile dysfunction, unspecified: Secondary | ICD-10-CM

## 2021-10-03 LAB — CMP14+EGFR
ALT: 23 IU/L (ref 0–44)
AST: 19 IU/L (ref 0–40)
Albumin/Globulin Ratio: 1.3 (ref 1.2–2.2)
Albumin: 4.7 g/dL (ref 3.8–4.9)
Alkaline Phosphatase: 116 IU/L (ref 44–121)
BUN/Creatinine Ratio: 11 (ref 9–20)
BUN: 16 mg/dL (ref 6–24)
Bilirubin Total: 0.8 mg/dL (ref 0.0–1.2)
CO2: 26 mmol/L (ref 20–29)
Calcium: 9.8 mg/dL (ref 8.7–10.2)
Chloride: 100 mmol/L (ref 96–106)
Creatinine, Ser: 1.41 mg/dL — ABNORMAL HIGH (ref 0.76–1.27)
Globulin, Total: 3.5 g/dL (ref 1.5–4.5)
Glucose: 86 mg/dL (ref 70–99)
Potassium: 4.8 mmol/L (ref 3.5–5.2)
Sodium: 138 mmol/L (ref 134–144)
Total Protein: 8.2 g/dL (ref 6.0–8.5)
eGFR: 58 mL/min/{1.73_m2} — ABNORMAL LOW (ref >59)

## 2021-10-03 MED ORDER — SILDENAFIL CITRATE 50 MG PO TABS: 50.0000 mg | ORAL_TABLET | Freq: Every day | ORAL | 0 refills | Status: AC | PRN

## 2021-10-03 MED ORDER — AMLODIPINE BESYLATE 10 MG PO TABS
10.0000 mg | ORAL_TABLET | Freq: Every day | ORAL | 0 refills | Status: DC
Start: 2021-10-03 — End: 2022-01-19

## 2021-10-03 MED ORDER — CARVEDILOL 12.5 MG PO TABS: 12.5000 mg | ORAL_TABLET | Freq: Two times a day (BID) | ORAL | 0 refills | Status: AC

## 2021-10-03 NOTE — Progress Notes (Signed)
Pt should avoid ibuprofen, drink at least 64 ounces of water daily due to his chronic kidney disease.

## 2021-10-09 ENCOUNTER — Telehealth: Payer: Self-pay | Admitting: Nurse Practitioner

## 2021-10-09 NOTE — Telephone Encounter (Signed)
Patient called in regard to UTI meds  ? ?Patient states that meds were discussed on last visit 3/16 ? ?Patient wants a call back when meds are sent in.  ? ? ?Call back # ?[phone] ?

## 2021-10-09 NOTE — Telephone Encounter (Signed)
Please advise pt seen 3/16 ?

## 2021-10-09 NOTE — Telephone Encounter (Signed)
Called pt advised of message pt is not having any symptoms of UTI. Pt and his wife is concern on where the chronic kidney disease is coming from on lab results. Pt was never informed that he had chronic kidney disease. Please advise. ?

## 2021-10-10 NOTE — Telephone Encounter (Signed)
Called pt's wife Elmyra Ricks advised of Travis Henry's message she states that pt had a brain injury and forgets things. She verbalized understanding of message  ?

## 2022-01-19 ENCOUNTER — Other Ambulatory Visit: Payer: Self-pay | Admitting: Nurse Practitioner

## 2022-02-03 ENCOUNTER — Ambulatory Visit: Payer: 59 | Admitting: Nurse Practitioner

## 2022-02-04 ENCOUNTER — Telehealth: Payer: Self-pay | Admitting: Nurse Practitioner

## 2022-02-04 NOTE — Telephone Encounter (Signed)
FYI    Pt wife called stating that they are separated & he has moved out. She wanted to let us know. States someone called yesterday about an appt for him.

## 2022-02-04 NOTE — Telephone Encounter (Signed)
Noted
